# Patient Record
Sex: Female | Born: 1965 | Race: White | Hispanic: No | Marital: Married | Smoking: Never smoker
Health system: Southern US, Community
[De-identification: ages and names within clinical notes are randomized; demographics above are authoritative.]

## PROBLEM LIST (undated history)

## (undated) DIAGNOSIS — J45909 Unspecified asthma, uncomplicated: Secondary | ICD-10-CM

## (undated) DIAGNOSIS — Q348 Other specified congenital malformations of respiratory system: Secondary | ICD-10-CM

## (undated) DIAGNOSIS — E785 Hyperlipidemia, unspecified: Secondary | ICD-10-CM

## (undated) DIAGNOSIS — T7840XA Allergy, unspecified, initial encounter: Secondary | ICD-10-CM

## (undated) DIAGNOSIS — I10 Essential (primary) hypertension: Secondary | ICD-10-CM

## (undated) DIAGNOSIS — E119 Type 2 diabetes mellitus without complications: Secondary | ICD-10-CM

## (undated) HISTORY — DX: Essential (primary) hypertension: I10

## (undated) HISTORY — DX: Unspecified asthma, uncomplicated: J45.909

## (undated) HISTORY — PX: CHOLECYSTECTOMY: SHX55

## (undated) HISTORY — DX: Type 2 diabetes mellitus without complications: E11.9

## (undated) HISTORY — PX: APPENDECTOMY: SHX54

## (undated) HISTORY — DX: Allergy, unspecified, initial encounter: T78.40XA

## (undated) HISTORY — PX: DILATION AND CURETTAGE OF UTERUS: SHX78

## (undated) HISTORY — DX: Hyperlipidemia, unspecified: E78.5

## (undated) HISTORY — PX: ABLATION: SHX5711

## (undated) HISTORY — DX: Other specified congenital malformations of respiratory system: Q34.8

---

## 1999-10-17 ENCOUNTER — Other Ambulatory Visit: Admission: RE | Admit: 1999-10-17 | Discharge: 1999-10-17 | Payer: Self-pay | Admitting: Obstetrics & Gynecology

## 2000-07-11 ENCOUNTER — Encounter (INDEPENDENT_AMBULATORY_CARE_PROVIDER_SITE_OTHER): Payer: Self-pay | Admitting: Specialist

## 2000-07-11 ENCOUNTER — Ambulatory Visit (HOSPITAL_COMMUNITY): Admission: RE | Admit: 2000-07-11 | Discharge: 2000-07-11 | Payer: Self-pay | Admitting: Obstetrics & Gynecology

## 2000-12-23 ENCOUNTER — Other Ambulatory Visit: Admission: RE | Admit: 2000-12-23 | Discharge: 2000-12-23 | Payer: Self-pay | Admitting: Obstetrics & Gynecology

## 2001-03-17 ENCOUNTER — Ambulatory Visit (HOSPITAL_COMMUNITY): Admission: RE | Admit: 2001-03-17 | Discharge: 2001-03-17 | Payer: Self-pay | Admitting: Family Medicine

## 2001-08-17 ENCOUNTER — Ambulatory Visit (HOSPITAL_COMMUNITY): Admission: RE | Admit: 2001-08-17 | Discharge: 2001-08-17 | Payer: Self-pay | Admitting: Family Medicine

## 2001-08-17 ENCOUNTER — Encounter: Payer: Self-pay | Admitting: Family Medicine

## 2001-08-19 ENCOUNTER — Encounter: Payer: Self-pay | Admitting: Family Medicine

## 2001-08-19 ENCOUNTER — Ambulatory Visit (HOSPITAL_COMMUNITY): Admission: RE | Admit: 2001-08-19 | Discharge: 2001-08-19 | Payer: Self-pay | Admitting: Family Medicine

## 2001-10-05 ENCOUNTER — Encounter: Payer: Self-pay | Admitting: General Surgery

## 2001-10-08 ENCOUNTER — Observation Stay (HOSPITAL_COMMUNITY): Admission: RE | Admit: 2001-10-08 | Discharge: 2001-10-09 | Payer: Self-pay | Admitting: General Surgery

## 2001-10-08 ENCOUNTER — Encounter (INDEPENDENT_AMBULATORY_CARE_PROVIDER_SITE_OTHER): Payer: Self-pay | Admitting: Specialist

## 2002-01-17 ENCOUNTER — Other Ambulatory Visit: Admission: RE | Admit: 2002-01-17 | Discharge: 2002-01-17 | Payer: Self-pay | Admitting: Obstetrics & Gynecology

## 2002-02-17 ENCOUNTER — Ambulatory Visit (HOSPITAL_COMMUNITY): Admission: RE | Admit: 2002-02-17 | Discharge: 2002-02-17 | Payer: Self-pay | Admitting: Internal Medicine

## 2002-02-22 ENCOUNTER — Encounter: Payer: Self-pay | Admitting: Internal Medicine

## 2002-02-22 ENCOUNTER — Ambulatory Visit (HOSPITAL_COMMUNITY): Admission: RE | Admit: 2002-02-22 | Discharge: 2002-02-22 | Payer: Self-pay | Admitting: Internal Medicine

## 2003-02-06 ENCOUNTER — Other Ambulatory Visit: Admission: RE | Admit: 2003-02-06 | Discharge: 2003-02-06 | Payer: Self-pay | Admitting: Obstetrics & Gynecology

## 2003-05-18 ENCOUNTER — Ambulatory Visit (HOSPITAL_COMMUNITY): Admission: RE | Admit: 2003-05-18 | Discharge: 2003-05-18 | Payer: Self-pay | Admitting: Family Medicine

## 2003-05-18 ENCOUNTER — Encounter: Payer: Self-pay | Admitting: Family Medicine

## 2004-03-06 ENCOUNTER — Other Ambulatory Visit: Admission: RE | Admit: 2004-03-06 | Discharge: 2004-03-06 | Payer: Self-pay | Admitting: Obstetrics & Gynecology

## 2004-08-02 ENCOUNTER — Ambulatory Visit (HOSPITAL_COMMUNITY): Admission: RE | Admit: 2004-08-02 | Discharge: 2004-08-02 | Payer: Self-pay | Admitting: Family Medicine

## 2005-05-06 ENCOUNTER — Other Ambulatory Visit: Admission: RE | Admit: 2005-05-06 | Discharge: 2005-05-06 | Payer: Self-pay | Admitting: Obstetrics & Gynecology

## 2009-08-09 ENCOUNTER — Ambulatory Visit (HOSPITAL_COMMUNITY): Admission: RE | Admit: 2009-08-09 | Discharge: 2009-08-09 | Payer: Self-pay | Admitting: Family Medicine

## 2009-08-15 ENCOUNTER — Ambulatory Visit: Payer: Self-pay | Admitting: Urgent Care

## 2009-08-15 DIAGNOSIS — E119 Type 2 diabetes mellitus without complications: Secondary | ICD-10-CM

## 2009-08-15 DIAGNOSIS — R198 Other specified symptoms and signs involving the digestive system and abdomen: Secondary | ICD-10-CM | POA: Insufficient documentation

## 2009-08-15 DIAGNOSIS — R1032 Left lower quadrant pain: Secondary | ICD-10-CM | POA: Insufficient documentation

## 2009-08-15 DIAGNOSIS — R109 Unspecified abdominal pain: Secondary | ICD-10-CM | POA: Insufficient documentation

## 2009-08-15 DIAGNOSIS — J45909 Unspecified asthma, uncomplicated: Secondary | ICD-10-CM | POA: Insufficient documentation

## 2009-09-04 ENCOUNTER — Encounter: Payer: Self-pay | Admitting: Gastroenterology

## 2009-09-10 LAB — CONVERTED CEMR LAB
Bacteria, UA: NONE SEEN
Basophils Absolute: 0.1 10*3/uL (ref 0.0–0.1)
Bilirubin Urine: NEGATIVE
Crystals: NONE SEEN
Eosinophils Relative: 2 % (ref 0–5)
Lymphocytes Relative: 34 % (ref 12–46)
Lymphs Abs: 2.5 10*3/uL (ref 0.7–4.0)
Neutro Abs: 3.7 10*3/uL (ref 1.7–7.7)
Neutrophils Relative %: 51 % (ref 43–77)
Nitrite: NEGATIVE
Platelets: 352 10*3/uL (ref 150–400)
Protein, ur: NEGATIVE mg/dL
RBC / HPF: NONE SEEN (ref <3)
RDW: 13 % (ref 11.5–15.5)
Specific Gravity, Urine: 1.005 (ref 1.005–1.030)
Urobilinogen, UA: 0.2 (ref 0.0–1.0)
WBC: 7.1 10*3/uL (ref 4.0–10.5)

## 2009-10-31 ENCOUNTER — Encounter: Payer: Self-pay | Admitting: Internal Medicine

## 2009-11-08 LAB — HM COLONOSCOPY

## 2009-11-27 ENCOUNTER — Ambulatory Visit (HOSPITAL_BASED_OUTPATIENT_CLINIC_OR_DEPARTMENT_OTHER): Admission: RE | Admit: 2009-11-27 | Discharge: 2009-11-27 | Payer: Self-pay | Admitting: Urology

## 2010-10-31 NOTE — Letter (Signed)
Summary: RELEASE OF RECORDS TO PT  RELEASE OF RECORDS TO PT   Imported By: Diana Eves 10/31/2009 13:21:50  _____________________________________________________________________  External Attachment:    Type:   Image     Comment:   External Document

## 2010-12-13 IMAGING — CT CT PELVIS W/ CM
2 of 5 series · 16 of 46 positions shown, 18 images · IV contrast (Omnipaque 300)
Comparison: 02/22/2002

CT ABDOMEN

CLINICAL DATA: Left lower quadrant pain, past history of diabetes,
hypertension, appendectomy, cholecystectomy

CT ABDOMEN AND PELVIS WITH CONTRAST
TECHNIQUE: Multidetector CT imaging of the abdomen and pelvis was
performed using the standard protocol following bolus
administration of intravenous contrast. Breast shield utilized.
Sagittal and coronal MPR images reconstructed from axial data set.
Contrast: Dilute oral contrast and 100 ml Kmnipaque-JCC IV

[Series 2: abd_pel_with 5.0 b40f · axial · 0.65mm/px · z∈[-447,-37]mm · 13 of 94 slices shown, 15 images]
[im 6/94  soft-tissue]
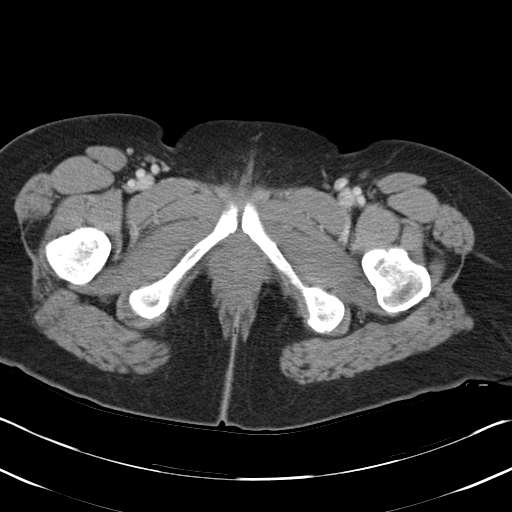
[im 6/94  bone]
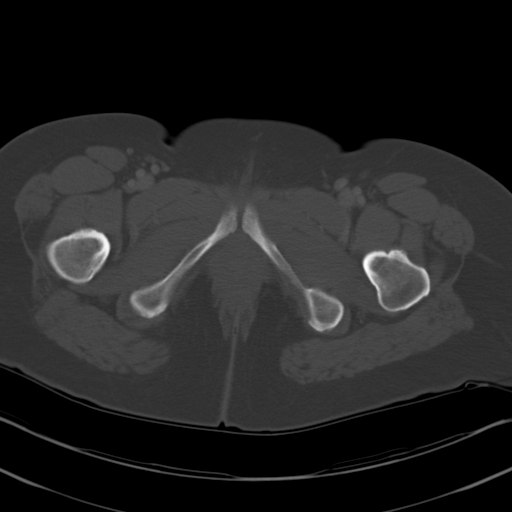
[im 11/94  soft-tissue]
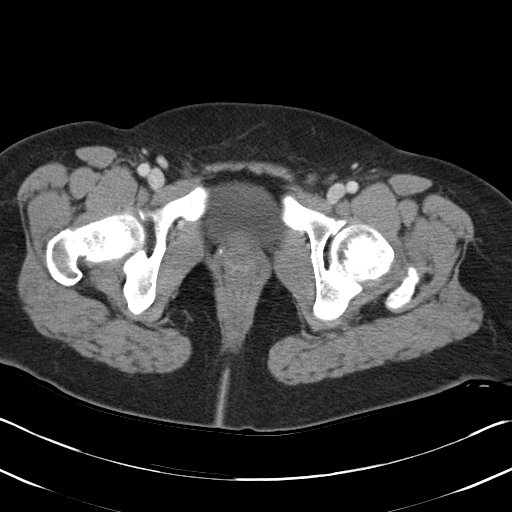
[im 22/94  soft-tissue]
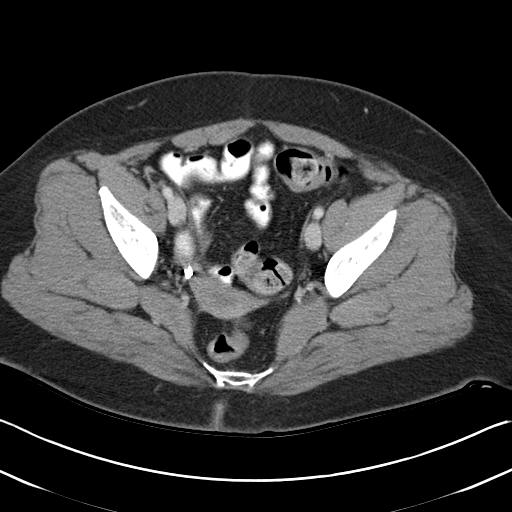
[im 28/94  soft-tissue]
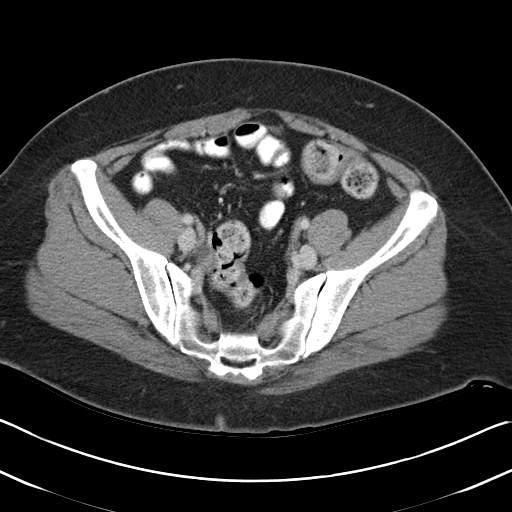
[im 33/94  soft-tissue]
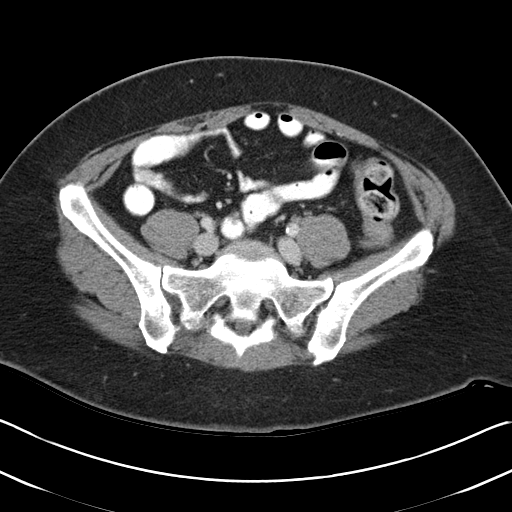
[im 39/94  soft-tissue]
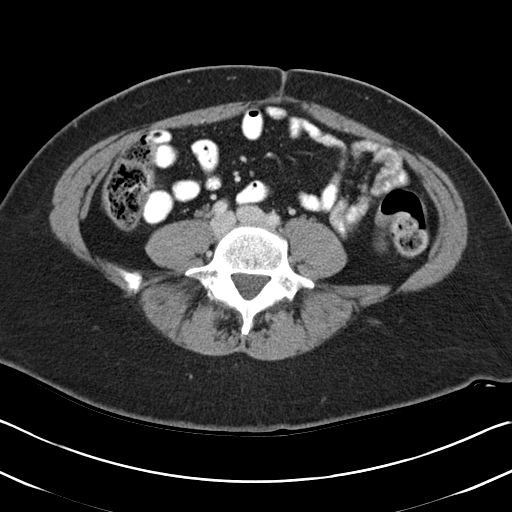
[im 50/94  soft-tissue]
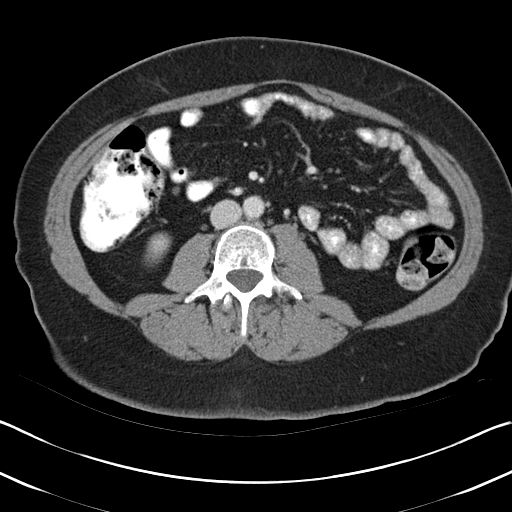
[im 55/94  soft-tissue]
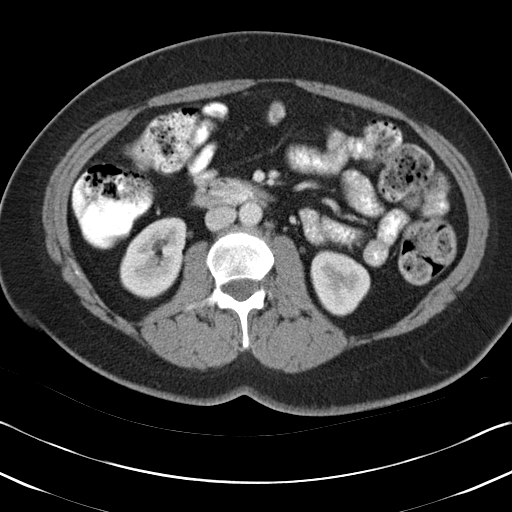
[im 61/94  soft-tissue]
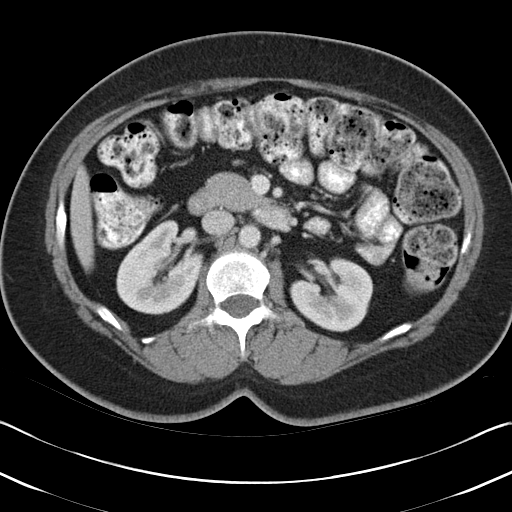
[im 61/94  bone]
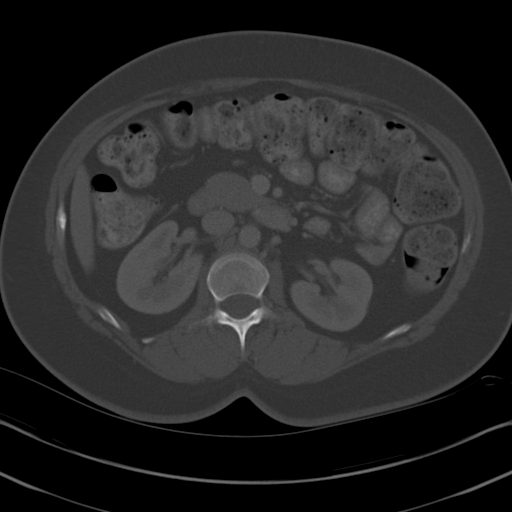
[im 66/94  soft-tissue]
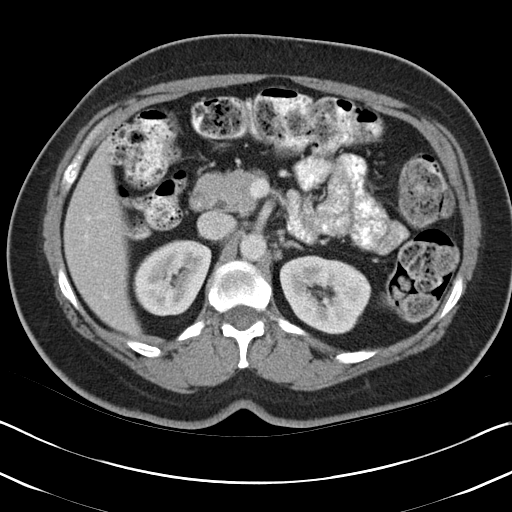
[im 72/94  soft-tissue]
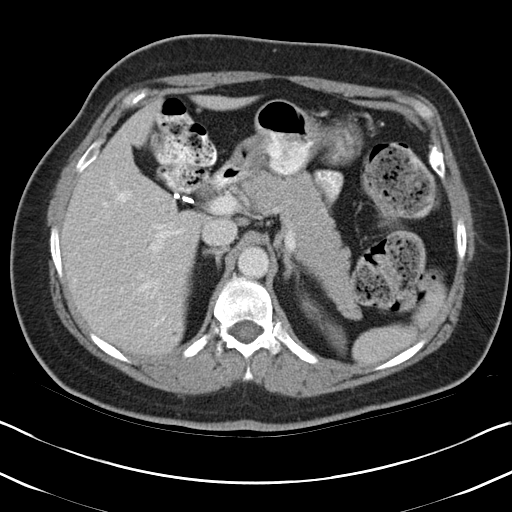
[im 83/94  soft-tissue]
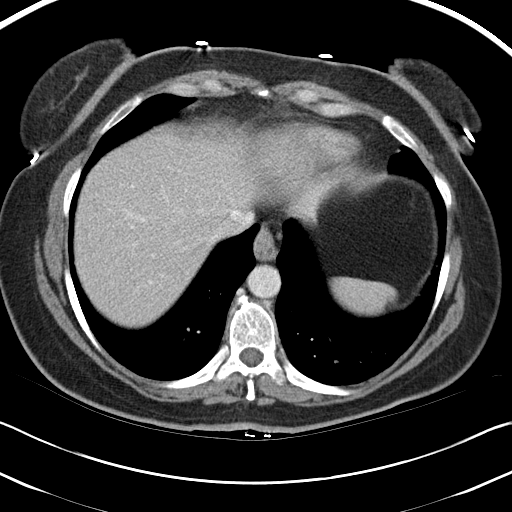
[im 88/94  soft-tissue]
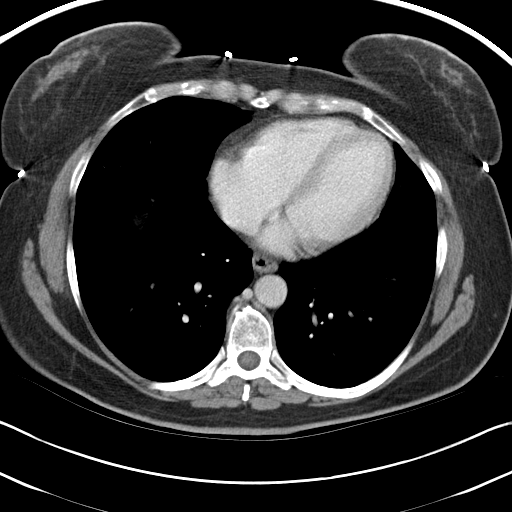

[Series 4: mpr cor post contrast (id) · coronal · 0.68mm/px · 3 of 73 slices shown]
[im 25/73  soft-tissue]
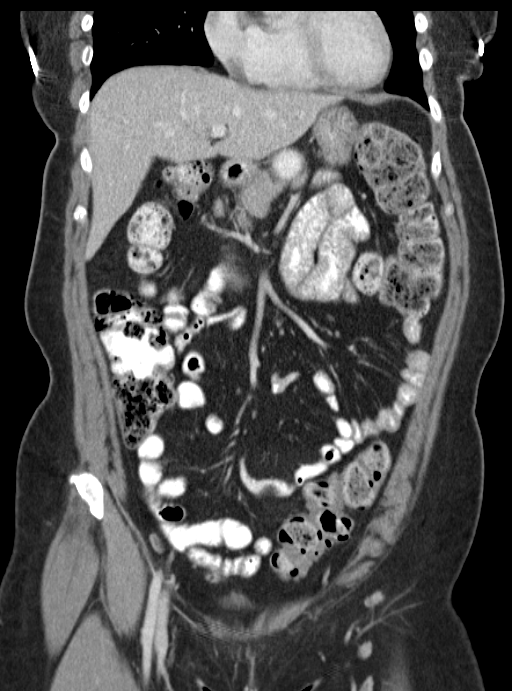
[im 33/73  soft-tissue]
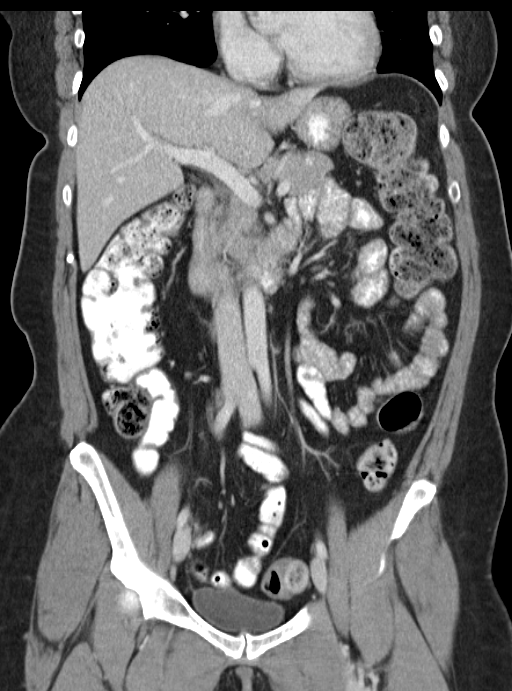
[im 41/73  soft-tissue]
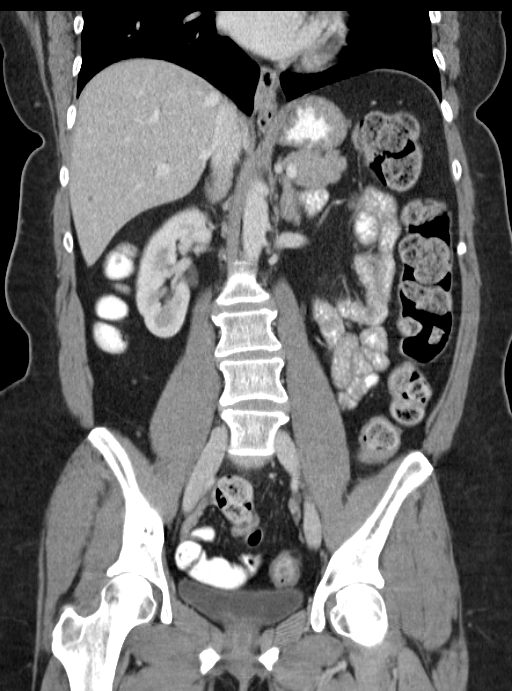

[16 of 46 positions shown; findings below may reference images not displayed]

FINDINGS: Lung bases clear.
Questionable 5 mm diameter low attenuation focus in right lobe
liver, image 26, not seen on prior exam.
Additional tiny less than 5 mm diameter low attenuation focus
medial aspect posterior right lobe liver image 23.
2 mm diameter nonobstructing calculus lower pole left kidney image
39.
Remainder of liver, spleen, pancreas, kidneys, and adrenal glands
unremarkable.
Tiny umbilical hernia containing fat.
No upper abdominal mass, adenopathy, free fluid or inflammatory
process.
Stomach and upper abdominal bowel loops unremarkable.
IMPRESSION: No acute upper abdominal process.
Tiny nonobstructing left renal calculus.
Tiny nonspecific low attenuation foci in liver, too small to
characterize.

CT PELVIS
FINDINGS: Unremarkable appearance of bladder, uterus, adnexae and distal
ureters.
Large and small bowel loops in pelvis normal.
No definite pelvic mass, adenopathy, free fluid or inflammatory
process.
Distal colon is incompletely distended, unable to adequately assess
wall thickness.
Question prior tubal ligation bilaterally.
No hernia or focal bony abnormality.
IMPRESSION: No acute intrapelvic abnormalities.

## 2010-12-20 LAB — GLUCOSE, CAPILLARY: Glucose-Capillary: 100 mg/dL — ABNORMAL HIGH (ref 70–99)

## 2010-12-20 LAB — POCT I-STAT, CHEM 8
BUN: 24 mg/dL — ABNORMAL HIGH (ref 6–23)
Chloride: 103 mEq/L (ref 96–112)
Glucose, Bld: 98 mg/dL (ref 70–99)
HCT: 46 % (ref 36.0–46.0)
Potassium: 5.4 mEq/L — ABNORMAL HIGH (ref 3.5–5.1)

## 2011-02-14 NOTE — Op Note (Signed)
[facility]  Patient:    Courtney Hunter, Courtney Hunter Visit Number: [id number] MRN: [id number]          Service Type: SUR Location: [id number] Attending Physician:  Delsa Bern Dictated by:   Lorne Skeens. Hoxworth, M.D. Proc. Date: 10/08/01 Admit Date:  10/08/2001 Discharge Date: 10/09/2001                             Operative Report  PREOPERATIVE DIAGNOSIS:  Biliary dyskinesia/chronic acalculous cholecystitis.  POSTOPERATIVE DIAGNOSIS:  Biliary dyskinesia/chronic acalculous cholecystitis.  SURGICAL PROCEDURE:  Laparoscopic cholecystectomy.  SURGEON:  Lorne Skeens. Hoxworth, M.D.  ASSISTANT:  Catalina Lunger, M.D.  ANESTHESIA:  General.  BRIEF HISTORY:  Ms. Walts is a 45 year old female, with recurrent episodes of right upper quadrant abdominal pain and nausea postprandially.  She has had a work-up of a negative abdominal ultrasound, but a HIDA scan showing reduced ejection fraction of 27%.  Her symptoms are persistent and classic for biliary disease, and we have elected to proceed with laparoscopic cholecystectomy in an attempt to relieve her symptoms.  The nature of the procedure, its indications, risks of bleeding, infection, bile leak, bile duct injury, and possible failure to relieve her symptoms were discussed and understood preoperatively.  She is now brought to the operating room for this procedure.  DESCRIPTION OF OPERATION:  The patient brought to the operating room and placed in the supine position on the operating table, and general endotracheal anesthesia was induced.  Preoperative antibiotics were given.  The abdomen was sterilely prepped and draped.  Local anesthesia was used to infiltrate the trocar sites prior to incision.  A 1 cm incision was made at the umbilicus and dissection carried down the midline fascia which was sharply incised for 1 cm. The peritoneum was entered under direct vision.  Through a mattress suture  of 0 Vicryl, the Hasson trocar was placed and pneumoperitoneum established. Under direct vision, a 10 mm trocar was placed in the subxiphoid area and two 5 mm trocars on the right subcostal margin.  The gallbladder was visualized and was not grossly inflamed.  The liver appeared normal.  The fundus was grasped and elevated up over the liver, and the infundibulum retracted inferolaterally.  Fibrofatty tissue was stripped off the neck of the gallbladder toward the porta hepatis.  The peritoneum and either side of the infundibulum was incised and the distal gallbladder and Calots triangle thoroughly dissected.  The cystic duct gallbladder junction was identified and the cystic duct dissected 360 degrees.  When the anatomy was cleared, the cystic duct was clipped at the junction with the gallbladder.  Operative cholangiogram was then attempted through the cystic duct, but the catheter wound not pass due to a valve or some other obstruction in the duct, and this was abandoned, and the cystic was doubly clipped proximally and divided.  The cystic artery clearly seen coursing up the gallbladder wall was doubly clipped proximally, clipped distally, and divided.  The gallbladder was then dissected free from its bed using hook cautery and removed through the umbilicus. complete hemostasis was assured in the operative site and the right upper quadrant thoroughly irrigated and suctioned until clear.  Trocars were removed under direct vision.  All CO2 evacuated from the peritoneal cavity.  The mattress suture was secured at the umbilicus.  Skin incisions were closed with interrupted subcuticular 4-0 Monocryl and Steri-Strips.  Sponge, needle, and instrument counts were correct.  Dry sterile dressings were applied.  The patient was taken to the recovery room in good condition. Dictated by:   Lorne Skeens. Hoxworth, M.D. Attending Physician:  Delsa Bern DD:  10/08/01 TD:  10/09/01 Job:  [id number]

## 2011-02-14 NOTE — Op Note (Signed)
[facility]  Patient:    Courtney Hunter, Courtney Hunter Visit Number: [id number] MRN: [id number]          Service Type: OUT Location: RAD Attending Physician:  Jonathon Bellows Dictated by:   Roetta Sessions, M.D. Proc. Date: 02/17/02 Admit Date:  02/22/2002   CC:         Lilyan Punt, M.D.   Operative Report  PROCEDURE:  Diagnostic esophagogastroduodenoscopy.  INDICATIONS FOR PROCEDURE:  The patient is a 45 year old lady with epigastric pain of five months duration. She underwent a laparoscopic cholecystectomy by Dr. Johna Sheriff in January of this year for biliary dyskinesia (gallbladder ______ of only 27% with reduction of the symptoms). Four days following cholecystectomy, she developed intermittent jabbing, boring epigastric pain which tends to occur at any time, sometimes is awakened from a sound sleep in the early morning hours. She has significantly improved with Prilosec and more recently a course of Nexium 40 mg orally daily but her symptoms have not received resolved. Interestingly, she tells me that her symptoms are improved with eating a meal. She says the right upper quadrant for which she underwent cholecystectomy have resolved. She occasionally has typical reflux symptoms but differentiates those from her current symptoms. She has not had any melena or rectal bleeding, she moves her bowels daily.  No early satiety, no nausea or vomiting. She does not use nonsteroidals and does not consume alcohol. EGD is now being done to further evaluate her symptoms. The approach has been discussed with the patient at length. The potential risks, benefits, and alternatives have been reviewed, questions answered. Please see my handwritten H&P for more information.  MONITORING:  O2 saturations, blood pressure, pulse and respirations monitored throughout the entire procedure.  CONSCIOUS SEDATION:  Versed 4 mg IV, Demerol 100 mg IV in divided doses.  INSTRUMENT:   Olympus video chip gastroscope.  FINDINGS:  Examination of the tubular esophagus revealed no mucosal abnormalities. There was an accentuated endulating Z line; however, the mucosa otherwise appeared normal. The LES was slight patulous. The EG junction was easily traversed.  STOMACH:  The gastric cavity was empty and insufflated well with air. A thorough examination of the gastric mucosa including a retroflexed view of the proximal stomach and esophagogastric junction demonstrated only a small hiatal hernia. The pylorus was patent and easily traversed.  DUODENUM:  The bulb and second portion appeared normal.  THERAPEUTIC/DIAGNOSTIC MANEUVERS PERFORMED:  None.  The patient tolerated the procedure very well and was reacted in endoscopy.  IMPRESSION:  Normal esophagus with slightly patulous lower esophageal sphincter. Small hiatal hernia and  remainder of stomach and duodenum through the second portion appeared normal.  No endoscopic explanation for the patients symptoms. She has responded to acid suppression therapy but her symptoms have not resolved. It is interesting that these new symptoms occurred four days after cholecystectomy. One wonders if she did not have a self limiting bile leak to account for some of her more prominent symptoms early on and she has jut improved spontaneously over time and the improvement with Nexium is coincidental. Alternatively, biliary dyskinesia needs to be kept in the differential. Clinically her symptoms sound somewhat like peptic disease. She may have had an ulcer which is now healed.  RECOMMENDATIONS:  1. Continue Nexium 40 mg orally daily.  2. Begin Levbid one tablet orally b.i.d.  3. CBC, amylase, LFTs, and metabolic 7 today and will go ahead and do an     abdominal CT scan to further evaluate her symptoms.  4. Further recommendations to follow.  I would like to thank Dr. Lilyan Punt for asking me to see this nice lady today. Dictated by:    Roetta Sessions, M.D. Attending Physician:  Jonathon Bellows DD:  02/17/02 TD:  02/21/02 Job: [phone] [id number] [id number]

## 2011-02-14 NOTE — Op Note (Signed)
[facility]  Patient:    Courtney Hunter, Courtney Hunter                     MRN: [id number] Proc. Date: 07/11/00 Adm. Date:  [id number] Attending:  Minette Headland                           Operative Report  PREOPERATIVE DIAGNOSES:  Dysfunctional uterine bleeding, abnormal endometrial cavity with marked thickening and tissue within the uterus measuring approximately 19 mm.  POSTOPERATIVE DIAGNOSES:  Dysfunctional uterine bleeding, abnormal endometrial cavity with marked thickening and tissue within the uterus measuring approximately 19 mucous membranes with filmy adhesions and marked irregularity of uterine cavity.  OPERATIVE PROCEDURE:  Hysteroscopy, dilatation and curettage.  SURGEON:  Freddy Finner, M.D.  ANESTHESIA:  Managed intravenous sedation and paracervical block.  HISTORY OF PRESENT ILLNESS:  The patient is a 45 year old white married female gravida 2, para 1 who has had bleeding consistently since September 3. An attempt has been made to manage her bleeding with ______ oral contraceptives t.i.d. which did slow the bleeding to some degree but then it immediately picked up again. She has had episodes of very heavy bleeding and passage of clots and has flooding accidents at work. On pelvic ultrasound in the office the day prior to surgery, she had a small simple cyst of the left ovary and marked endometrial thickening to 18.9 mm. She was admitted today for D&C.  DESCRIPTION OF PROCEDURE:  She was admitted on the morning of surgery and brought to the operating room. There placed on adequate intravenous sedation. Placed in the dorsolithotomy position using the [employer]. Betadine prep was carried out in the usual fashion. Bivalve speculum was introduced after sterile drapes were applied. Cervix was visualized and a paracervical block was placed using a total of 10 cc of 1% Xylocaine. Injections were made at 4 and 8 oclock in the vaginal  fornices. Cervix was then grasped with a single-tooth tenaculum, progressively dilated to 25 with Hanks dilators. The ACMI 12.5 hysteroscope was introduced using 3% sorbitol as a distending medium and examination of the endometrial cavity was carried out with findings as noted above. ______ curettage was then accomplished with a Heaney curette and the cavity was explored with Randall stone forceps. A moderate amount of tissue was obtained. Reinspection revealed a more normal-appearing cavity. Procedure was then terminated. All instruments were removed. The sorbitol deficit was 50 cc. The patient was taken to recovery in good condition. Will be discharged in the immediate postop period for follow-up in the office in approximately 2 weeks. She is to take ibuprofen or Tylenol as needed for postoperative pain or Darvocet for more severe pain. DD:  07/11/00 TD:  07/11/00 Job: [id number]

## 2011-10-20 ENCOUNTER — Ambulatory Visit (HOSPITAL_COMMUNITY)
Admission: RE | Admit: 2011-10-20 | Discharge: 2011-10-20 | Disposition: A | Discharge status: Home / Self Care | Payer: 59 | Source: Ambulatory Visit | Attending: Family Medicine | Admitting: Family Medicine

## 2011-10-20 ENCOUNTER — Other Ambulatory Visit: Payer: Self-pay | Admitting: Family Medicine

## 2011-10-20 DIAGNOSIS — M25561 Pain in right knee: Secondary | ICD-10-CM

## 2011-10-20 DIAGNOSIS — M25569 Pain in unspecified knee: Secondary | ICD-10-CM | POA: Insufficient documentation

## 2011-11-14 ENCOUNTER — Other Ambulatory Visit (HOSPITAL_COMMUNITY): Payer: Self-pay | Admitting: Orthopedic Surgery

## 2011-11-14 DIAGNOSIS — M25561 Pain in right knee: Secondary | ICD-10-CM

## 2011-11-18 ENCOUNTER — Ambulatory Visit (HOSPITAL_COMMUNITY): Payer: 59

## 2011-12-02 ENCOUNTER — Ambulatory Visit (HOSPITAL_COMMUNITY)
Admission: RE | Admit: 2011-12-02 | Discharge: 2011-12-02 | Disposition: A | Discharge status: Home / Self Care | Payer: 59 | Source: Ambulatory Visit | Attending: Orthopedic Surgery | Admitting: Orthopedic Surgery

## 2011-12-02 DIAGNOSIS — M23329 Other meniscus derangements, posterior horn of medial meniscus, unspecified knee: Secondary | ICD-10-CM | POA: Insufficient documentation

## 2011-12-02 DIAGNOSIS — IMO0002 Reserved for concepts with insufficient information to code with codable children: Secondary | ICD-10-CM | POA: Insufficient documentation

## 2011-12-02 DIAGNOSIS — M1710 Unilateral primary osteoarthritis, unspecified knee: Secondary | ICD-10-CM | POA: Insufficient documentation

## 2011-12-02 DIAGNOSIS — M25569 Pain in unspecified knee: Secondary | ICD-10-CM | POA: Insufficient documentation

## 2011-12-02 DIAGNOSIS — M25561 Pain in right knee: Secondary | ICD-10-CM

## 2012-01-13 ENCOUNTER — Ambulatory Visit
Admission: RE | Admit: 2012-01-13 | Discharge: 2012-01-13 | Disposition: A | Discharge status: Home / Self Care | Payer: 59 | Source: Ambulatory Visit | Attending: Orthopedic Surgery | Admitting: Orthopedic Surgery

## 2012-01-13 ENCOUNTER — Other Ambulatory Visit: Payer: Self-pay | Admitting: Orthopedic Surgery

## 2012-01-13 DIAGNOSIS — M79669 Pain in unspecified lower leg: Secondary | ICD-10-CM

## 2013-01-06 ENCOUNTER — Telehealth: Payer: Self-pay | Admitting: Family Medicine

## 2013-01-06 DIAGNOSIS — E119 Type 2 diabetes mellitus without complications: Secondary | ICD-10-CM

## 2013-01-06 DIAGNOSIS — Z79899 Other long term (current) drug therapy: Secondary | ICD-10-CM

## 2013-01-06 DIAGNOSIS — E782 Mixed hyperlipidemia: Secondary | ICD-10-CM

## 2013-01-06 NOTE — Telephone Encounter (Signed)
Please do blood work papers for visit on 4/25

## 2013-01-07 NOTE — Telephone Encounter (Signed)
Blood work papers printed and left up front for patient. Patient notified. 

## 2013-01-07 NOTE — Telephone Encounter (Signed)
Metabolic 7, lipid profile, liver profile, hemoglobin A1c, urine micro-protein

## 2013-01-14 ENCOUNTER — Encounter: Payer: Self-pay | Admitting: *Deleted

## 2013-01-21 ENCOUNTER — Ambulatory Visit: Payer: Self-pay | Admitting: Family Medicine

## 2013-01-25 ENCOUNTER — Ambulatory Visit: Payer: Self-pay | Admitting: Family Medicine

## 2013-01-25 ENCOUNTER — Other Ambulatory Visit: Payer: Self-pay | Admitting: Family Medicine

## 2013-01-27 ENCOUNTER — Other Ambulatory Visit: Payer: Self-pay | Admitting: Family Medicine

## 2013-01-28 LAB — LIPID PANEL
HDL: 44 mg/dL (ref >39)
Total CHOL/HDL Ratio: 4 Ratio
Triglycerides: 201 mg/dL — ABNORMAL HIGH (ref <150)

## 2013-01-28 LAB — BASIC METABOLIC PANEL
Calcium: 9.9 mg/dL (ref 8.4–10.5)
Creat: 0.99 mg/dL (ref 0.50–1.10)
Sodium: 139 mEq/L (ref 135–145)

## 2013-01-28 LAB — HEPATIC FUNCTION PANEL
ALT: 22 U/L (ref 0–35)
AST: 18 U/L (ref 0–37)
Albumin: 4.1 g/dL (ref 3.5–5.2)
Alkaline Phosphatase: 57 U/L (ref 39–117)
Total Protein: 6.7 g/dL (ref 6.0–8.3)

## 2013-01-28 LAB — HEMOGLOBIN A1C
Hgb A1c MFr Bld: 5.5 % (ref <5.7)
Mean Plasma Glucose: 111 mg/dL (ref <117)

## 2013-02-01 ENCOUNTER — Encounter: Payer: Self-pay | Admitting: Family Medicine

## 2013-02-01 ENCOUNTER — Ambulatory Visit (INDEPENDENT_AMBULATORY_CARE_PROVIDER_SITE_OTHER): Payer: 59 | Admitting: Family Medicine

## 2013-02-01 VITALS — BP 130/78 | HR 80 | Wt 190.6 lb

## 2013-02-01 DIAGNOSIS — K219 Gastro-esophageal reflux disease without esophagitis: Secondary | ICD-10-CM | POA: Insufficient documentation

## 2013-02-01 DIAGNOSIS — E119 Type 2 diabetes mellitus without complications: Secondary | ICD-10-CM

## 2013-02-01 DIAGNOSIS — J309 Allergic rhinitis, unspecified: Secondary | ICD-10-CM

## 2013-02-01 DIAGNOSIS — E785 Hyperlipidemia, unspecified: Secondary | ICD-10-CM | POA: Insufficient documentation

## 2013-02-01 DIAGNOSIS — I10 Essential (primary) hypertension: Secondary | ICD-10-CM | POA: Insufficient documentation

## 2013-02-01 DIAGNOSIS — G4700 Insomnia, unspecified: Secondary | ICD-10-CM

## 2013-02-01 MED ORDER — ALPRAZOLAM 1 MG PO TABS: 1.0000 mg | ORAL_TABLET | Freq: Every evening | ORAL | Status: DC | PRN

## 2013-02-01 NOTE — Progress Notes (Signed)
  Subjective:    Patient ID: Courtney Hunter, female    DOB: 29-Feb-1966, 47 y.o.   MRN: [id number]  HPI Weight watchers-patient is trying to lose weight she is joining [employer]. They're trying to have her journal her diet. Also trying to keep her more physically active she feels that this will help her bring her weight down and help her other health issues. Prediabetes-she is trying to watch his starches in her diet she is taking metformin once a day she denies blurred vision excessive thirst or urination denies numbness or tingling in the feet. hyperetension-she is state that she is doing a good job of taking her medications and she is trying to lose weight as indicated above she denies headaches chest pressure or shortness of breath Hyperlipidemia-she is trying to limit fried foods she is taking her medications she recently had lab work completed. Allergic rhinitis-she does relate some mild allergy issues congestion sneezing postnasal drip she is using her spray and medication it helped somewhat Reflux-she states that she does not take her PPI she does have significant heartburn and discomfort so therefore she wants to continue taking her medicine. Foot pain-she relates that she had right heel pain after doing 3-4 miles or walking a few weeks ago for the first time in a while. Denies any other injury. Has tried some medication for without much success. It sounds like she may have plantar faciitis  Past medical history, allergic rhinitis, hypertension, asthma, hyperlipidemia, prediabetes. Family history noncontributory Does not smoke    Review of Systems See above.    Objective:   Physical Examno acute distress. Eardrums normal throat is normal neck no masses lungs are clear heart is regular pulses normal abdomen soft obese extremities no edema tenderness in the right heel. Blood pressure was rechecked. Normal.       Assessment & Plan:  #1 weight loss-I. Do recommend the patient  try to lose 20-30 pounds. Weight Watchers is a good idea for her. Reflux-continue omeprazole daily keeps it under good control Hyperlipidemia she is at her goal she is to continue her medication and a low-fat diet Prediabetes under good control continue metformin Hypertension good control continue current medication Allergic rhinitis-continue medication and Flonase. Should gradually improve as pollen counts go down. I recommend for the patient followup again in 6 months time Plantar fasciitis showed her some exercises she can do anti-inflammatories when necessary followup with podiatry if ongoing trouble may need to do an injection. Insomnia and Xanax at nighttime when necessary.

## 2013-02-22 ENCOUNTER — Other Ambulatory Visit: Payer: Self-pay | Admitting: Family Medicine

## 2013-03-14 ENCOUNTER — Other Ambulatory Visit: Payer: Self-pay | Admitting: Family Medicine

## 2013-03-22 ENCOUNTER — Other Ambulatory Visit: Payer: Self-pay | Admitting: Family Medicine

## 2013-04-06 ENCOUNTER — Other Ambulatory Visit: Payer: Self-pay | Admitting: Family Medicine

## 2013-04-27 ENCOUNTER — Other Ambulatory Visit: Payer: Self-pay | Admitting: *Deleted

## 2013-04-27 MED ORDER — PRAVASTATIN SODIUM 40 MG PO TABS: 40.0000 mg | ORAL_TABLET | Freq: Every day | ORAL | Status: DC

## 2013-04-29 ENCOUNTER — Other Ambulatory Visit: Payer: Self-pay | Admitting: Family Medicine

## 2013-05-20 ENCOUNTER — Other Ambulatory Visit: Payer: Self-pay | Admitting: Family Medicine

## 2013-06-13 ENCOUNTER — Other Ambulatory Visit: Payer: Self-pay | Admitting: Family Medicine

## 2013-06-16 ENCOUNTER — Other Ambulatory Visit: Payer: Self-pay | Admitting: Family Medicine

## 2013-06-30 ENCOUNTER — Other Ambulatory Visit: Payer: Self-pay | Admitting: *Deleted

## 2013-06-30 MED ORDER — CITALOPRAM HYDROBROMIDE 20 MG PO TABS: 20.0000 mg | ORAL_TABLET | Freq: Every day | ORAL | Status: DC

## 2013-06-30 MED ORDER — HYDROCHLOROTHIAZIDE 25 MG PO TABS: ORAL_TABLET | ORAL | Status: DC

## 2013-07-01 ENCOUNTER — Telehealth: Payer: Self-pay | Admitting: Family Medicine

## 2013-07-01 ENCOUNTER — Other Ambulatory Visit: Payer: Self-pay | Admitting: *Deleted

## 2013-07-01 MED ORDER — HYDROCHLOROTHIAZIDE 25 MG PO TABS: ORAL_TABLET | ORAL | Status: DC

## 2013-07-01 MED ORDER — CITALOPRAM HYDROBROMIDE 20 MG PO TABS: 20.0000 mg | ORAL_TABLET | Freq: Every day | ORAL | Status: DC

## 2013-07-01 NOTE — Telephone Encounter (Signed)
States she needs to have blood work completed for her 6 month follow up on 08/12/2013 @ 8:10am.  Please call patient to let her know when she can go to the lab.

## 2013-07-08 ENCOUNTER — Other Ambulatory Visit: Payer: Self-pay | Admitting: Nurse Practitioner

## 2013-07-08 DIAGNOSIS — E785 Hyperlipidemia, unspecified: Secondary | ICD-10-CM

## 2013-07-08 DIAGNOSIS — E119 Type 2 diabetes mellitus without complications: Secondary | ICD-10-CM

## 2013-07-08 DIAGNOSIS — Z79899 Other long term (current) drug therapy: Secondary | ICD-10-CM

## 2013-07-20 ENCOUNTER — Other Ambulatory Visit: Payer: Self-pay | Admitting: *Deleted

## 2013-07-20 MED ORDER — ALPRAZOLAM 1 MG PO TABS: 1.0000 mg | ORAL_TABLET | Freq: Every evening | ORAL | Status: DC | PRN

## 2013-07-25 ENCOUNTER — Other Ambulatory Visit: Payer: Self-pay | Admitting: *Deleted

## 2013-07-25 MED ORDER — ALPRAZOLAM 1 MG PO TABS: 1.0000 mg | ORAL_TABLET | Freq: Every evening | ORAL | Status: DC | PRN

## 2013-08-03 ENCOUNTER — Other Ambulatory Visit: Payer: Self-pay | Admitting: Family Medicine

## 2013-08-09 ENCOUNTER — Telehealth: Payer: Self-pay | Admitting: Family Medicine

## 2013-08-09 NOTE — Telephone Encounter (Signed)
Spoke with patient and explained to her she didn't need the BW papers and that she can just show up to get the BW done. Pt verbalized understanding and we will see her on the 20th.

## 2013-08-09 NOTE — Telephone Encounter (Signed)
Pt has appt on 11/20 please send BW orders and call pt when done

## 2013-08-09 NOTE — Telephone Encounter (Signed)
She should get those labs as ordered. If patient wants additional lab was then add. If she needs me papers been given to her please thank you

## 2013-08-09 NOTE — Telephone Encounter (Signed)
Courtney Hunter already ordered blood work in the system on 07/08/13. Should patient have this completed or do you want to order something else?

## 2013-08-12 ENCOUNTER — Ambulatory Visit: Payer: 59 | Admitting: Family Medicine

## 2013-08-15 LAB — HEPATIC FUNCTION PANEL
ALT: 25 U/L (ref 0–35)
Alkaline Phosphatase: 57 U/L (ref 39–117)
Bilirubin, Direct: 0.1 mg/dL (ref 0.0–0.3)
Total Protein: 6.8 g/dL (ref 6.0–8.3)

## 2013-08-15 LAB — LIPID PANEL
Cholesterol: 210 mg/dL — ABNORMAL HIGH (ref 0–200)
Triglycerides: 251 mg/dL — ABNORMAL HIGH (ref <150)
VLDL: 50 mg/dL — ABNORMAL HIGH (ref 0–40)

## 2013-08-15 LAB — HEMOGLOBIN A1C: Hgb A1c MFr Bld: 5.8 % — ABNORMAL HIGH (ref <5.7)

## 2013-08-17 ENCOUNTER — Other Ambulatory Visit: Payer: Self-pay | Admitting: Family Medicine

## 2013-08-18 ENCOUNTER — Encounter: Payer: Self-pay | Admitting: Family Medicine

## 2013-08-18 ENCOUNTER — Ambulatory Visit (INDEPENDENT_AMBULATORY_CARE_PROVIDER_SITE_OTHER): Payer: 59 | Admitting: Family Medicine

## 2013-08-18 VITALS — BP 128/88 | Ht 60.0 in | Wt 195.0 lb

## 2013-08-18 DIAGNOSIS — I10 Essential (primary) hypertension: Secondary | ICD-10-CM

## 2013-08-18 DIAGNOSIS — K219 Gastro-esophageal reflux disease without esophagitis: Secondary | ICD-10-CM

## 2013-08-18 DIAGNOSIS — E119 Type 2 diabetes mellitus without complications: Secondary | ICD-10-CM

## 2013-08-18 DIAGNOSIS — R7303 Prediabetes: Secondary | ICD-10-CM

## 2013-08-18 DIAGNOSIS — R7309 Other abnormal glucose: Secondary | ICD-10-CM

## 2013-08-18 DIAGNOSIS — Z23 Encounter for immunization: Secondary | ICD-10-CM

## 2013-08-18 MED ORDER — ALPRAZOLAM 1 MG PO TABS: 1.0000 mg | ORAL_TABLET | Freq: Every evening | ORAL | Status: DC | PRN

## 2013-08-18 NOTE — Progress Notes (Signed)
  Subjective:    Patient ID: Courtney Hunter, female    DOB: 01-Jun-1966, 47 y.o.   MRN: [id number]  Diabetes She presents for her follow-up diabetic visit. She has type 2 diabetes mellitus. Her disease course has been stable. Pertinent negatives for hypoglycemia include no confusion or dizziness. Pertinent negatives for diabetes include no blurred vision, no chest pain, no fatigue, no foot ulcerations and no weight loss. Pertinent negatives for hypoglycemia complications include no blackouts and no hospitalization. Symptoms are resolved. Pertinent negatives for diabetic complications include no autonomic neuropathy, impotence, nephropathy, peripheral neuropathy or PVD. Risk factors for coronary artery disease include diabetes mellitus, dyslipidemia, family history and obesity. Current diabetic treatment includes oral agent (monotherapy). She is compliant with treatment all of the time. Her weight is stable. She is following a diabetic diet. Meal planning includes avoidance of concentrated sweets and calorie counting. She participates in exercise intermittently (has heel spur). An ACE inhibitor/angiotensin II receptor blocker is being taken. She sees a podiatrist.Eye exam is not current (reminder to do it given).   Patient is here today for a 6 month check up. Patient has reflux under fairly good control has hypertension she takes medication watches her diet also has cholesterol issues watch his diet takes medication. Her A1C was 5.8. Blood sugars have been WNL.  Would like the Flu vaccine.   Review of Systems  Constitutional: Negative for weight loss, appetite change and fatigue.  HENT: Negative for rhinorrhea and sinus pressure.   Eyes: Negative for blurred vision.  Respiratory: Negative for shortness of breath and wheezing.   Cardiovascular: Negative for chest pain.  Gastrointestinal: Negative for abdominal pain.  Genitourinary: Negative for impotence and difficulty urinating.  Musculoskeletal:  Positive for arthralgias (knee pain /uses ice). Negative for back pain.  Neurological: Negative for dizziness.  Psychiatric/Behavioral: Negative for confusion.       Objective:   Physical Exam  Vitals reviewed. Constitutional: She is oriented to person, place, and time. She appears well-developed. No distress.  HENT:  Head: Normocephalic and atraumatic.  Neck: Normal range of motion. Neck supple.  Cardiovascular: Normal rate, regular rhythm and normal heart sounds.   No murmur heard. Pulmonary/Chest: Effort normal and breath sounds normal. No respiratory distress. She has no wheezes.  Abdominal: Soft.  Musculoskeletal: Normal range of motion. She exhibits no edema.  Neurological: She is alert and oriented to person, place, and time. No cranial nerve deficit.          Assessment & Plan:  #1 diabetes-actually prediabetes. Patient under good control watch diet stay physically active. If A1c goes up in the future may consider Victoza #2 blood pressure under good control, continue current medication #3 reflux under good control continue current medication #4 hyperlipidemia lab work slightly worse than what it was but still acceptable watch diet continue medication Followup 6 months 25 minutes spent with patient discussing all of these issues

## 2013-09-08 ENCOUNTER — Other Ambulatory Visit: Payer: Self-pay | Admitting: Family Medicine

## 2013-09-17 ENCOUNTER — Other Ambulatory Visit: Payer: Self-pay | Admitting: Family Medicine

## 2013-09-30 ENCOUNTER — Other Ambulatory Visit: Payer: Self-pay | Admitting: Family Medicine

## 2013-10-25 ENCOUNTER — Telehealth: Payer: Self-pay | Admitting: Nurse Practitioner

## 2013-10-25 NOTE — Telephone Encounter (Signed)
Patients husband was seen yesterday and wanted to know if we can just call something in For her coughing, low grade fever (around 100, 101). No body aches and has inhaler at home.  [facility]

## 2013-10-26 ENCOUNTER — Encounter: Payer: Self-pay | Admitting: Family Medicine

## 2013-10-26 ENCOUNTER — Ambulatory Visit (INDEPENDENT_AMBULATORY_CARE_PROVIDER_SITE_OTHER): Payer: 59 | Admitting: Family Medicine

## 2013-10-26 VITALS — BP 124/80 | Temp 100.0°F | Ht 60.0 in | Wt 191.0 lb

## 2013-10-26 DIAGNOSIS — J111 Influenza due to unidentified influenza virus with other respiratory manifestations: Secondary | ICD-10-CM

## 2013-10-26 DIAGNOSIS — J209 Acute bronchitis, unspecified: Secondary | ICD-10-CM

## 2013-10-26 DIAGNOSIS — J45909 Unspecified asthma, uncomplicated: Secondary | ICD-10-CM

## 2013-10-26 MED ORDER — CEFPROZIL 500 MG PO TABS: 500.0000 mg | ORAL_TABLET | Freq: Two times a day (BID) | ORAL | Status: DC

## 2013-10-26 MED ORDER — PREDNISONE 20 MG PO TABS: ORAL_TABLET | ORAL | Status: DC

## 2013-10-26 NOTE — Patient Instructions (Signed)

## 2013-10-26 NOTE — Telephone Encounter (Signed)
Having fever for 2 days, cough, chest congestion, wheezing. Pt advised we cannot call in antibiotics without being seen. Pt made appt for today.

## 2013-10-26 NOTE — Progress Notes (Signed)
   Subjective:    Patient ID: Courtney Hunter, female    DOB: January 20, 1966, 48 y.o.   MRN: [id number]  Cough This is a new problem. The current episode started in the past 7 days. Associated symptoms include a fever, headaches and wheezing. Associated symptoms comments: Back pain. Treatments tried: OTC cold meds. The treatment provided no relief.   Sun with some fatigue Monday coughed a lot, not feeling good Tuesday 101 felt fatigued, coughing, headache, Wed-  Some fvevr , cough, sweat, backaches, no N or V PMH benign  Review of Systems  Constitutional: Positive for fever.  Respiratory: Positive for cough and wheezing.   Neurological: Positive for headaches.       Objective:   Physical Exam  Nursing note and vitals reviewed. Constitutional: She appears well-developed.  HENT:  Head: Normocephalic.  Nose: Nose normal.  Mouth/Throat: Oropharynx is clear and moist. No oropharyngeal exudate.  Neck: Neck supple.  Cardiovascular: Normal rate and normal heart sounds.   No murmur heard. Pulmonary/Chest: Effort normal and breath sounds normal. She has no wheezes.  Lymphadenopathy:    She has no cervical adenopathy.  Skin: Skin is warm and dry.          Assessment & Plan:  Influenza-the patient was diagnosed with influenza. Patient/family educated about the flu and warning signs to watch for. If difficulty breathing, severe neck pain and stiffness, cyanosis, disorientation, or progressive worsening then immediately get rechecked at that ER. If progressive symptoms be certain to be rechecked. Supportive measures such as Tylenol/ibuprofen was discussed. No aspirin use in children. And influenza home care instruction sheet was given.  It is felt that this patient is dealing with a mild case of the flu may end up getting some secondary infections this was discussed with the patient. I believe she is starting to show some bronchitis symptoms as a result. Prescription for prednisone  Serevent as well as antibiotics.

## 2013-10-26 NOTE — Telephone Encounter (Signed)
NTC- review sx

## 2013-11-16 ENCOUNTER — Other Ambulatory Visit: Payer: Self-pay | Admitting: Family Medicine

## 2013-12-19 ENCOUNTER — Other Ambulatory Visit: Payer: Self-pay | Admitting: Family Medicine

## 2013-12-20 ENCOUNTER — Other Ambulatory Visit: Payer: Self-pay | Admitting: Family Medicine

## 2014-01-16 ENCOUNTER — Other Ambulatory Visit: Payer: Self-pay | Admitting: Family Medicine

## 2014-01-16 NOTE — Telephone Encounter (Signed)
Seen on 10/18/13 for sickness

## 2014-01-17 NOTE — Telephone Encounter (Signed)
Refill x2 will need followup office visit

## 2014-01-19 ENCOUNTER — Telehealth: Payer: Self-pay | Admitting: Family Medicine

## 2014-01-19 DIAGNOSIS — E119 Type 2 diabetes mellitus without complications: Secondary | ICD-10-CM

## 2014-01-19 DIAGNOSIS — R5381 Other malaise: Secondary | ICD-10-CM

## 2014-01-19 DIAGNOSIS — E785 Hyperlipidemia, unspecified: Secondary | ICD-10-CM

## 2014-01-19 DIAGNOSIS — Z79899 Other long term (current) drug therapy: Secondary | ICD-10-CM

## 2014-01-19 DIAGNOSIS — R5383 Other fatigue: Secondary | ICD-10-CM

## 2014-01-19 NOTE — Telephone Encounter (Signed)
Bloodwork ordered in the system. Patient was notified.  

## 2014-01-19 NOTE — Telephone Encounter (Signed)
TSH, lipid, met 7 , irine micro,A1C and f/u ov

## 2014-01-19 NOTE — Telephone Encounter (Signed)
Patient needs BW ordered, also would like to check her thyroid.

## 2014-01-19 NOTE — Telephone Encounter (Signed)
08/15/13 had A1C, lipid, liver, microalb urine. Pt requesting TSH

## 2014-01-26 ENCOUNTER — Other Ambulatory Visit: Payer: Self-pay | Admitting: Family Medicine

## 2014-01-27 LAB — BASIC METABOLIC PANEL
BUN: 15 mg/dL (ref 6–23)
CALCIUM: 9.4 mg/dL (ref 8.4–10.5)
CO2: 28 mEq/L (ref 19–32)
Chloride: 101 mEq/L (ref 96–112)
Creat: 0.89 mg/dL (ref 0.50–1.10)
Glucose, Bld: 111 mg/dL — ABNORMAL HIGH (ref 70–99)
Potassium: 3.9 mEq/L (ref 3.5–5.3)
Sodium: 138 mEq/L (ref 135–145)

## 2014-01-27 LAB — LIPID PANEL
CHOL/HDL RATIO: 3.8 ratio
CHOLESTEROL: 165 mg/dL (ref 0–200)
HDL: 44 mg/dL (ref >39)
LDL CALC: 83 mg/dL (ref 0–99)
TRIGLYCERIDES: 191 mg/dL — AB (ref <150)
VLDL: 38 mg/dL (ref 0–40)

## 2014-01-27 LAB — TSH: TSH: 2.39 u[IU]/mL (ref 0.350–4.500)

## 2014-01-28 LAB — MICROALBUMIN, URINE: MICROALB UR: 0.89 mg/dL (ref 0.00–1.89)

## 2014-01-30 ENCOUNTER — Encounter: Payer: Self-pay | Admitting: Family Medicine

## 2014-01-30 ENCOUNTER — Ambulatory Visit (INDEPENDENT_AMBULATORY_CARE_PROVIDER_SITE_OTHER): Payer: 59 | Admitting: Family Medicine

## 2014-01-30 VITALS — BP 122/82 | Ht 60.0 in | Wt 201.0 lb

## 2014-01-30 DIAGNOSIS — R21 Rash and other nonspecific skin eruption: Secondary | ICD-10-CM

## 2014-01-30 MED ORDER — PREDNISONE 10 MG PO TABS: ORAL_TABLET | ORAL | Status: DC
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
## 2014-02-01 ENCOUNTER — Ambulatory Visit (INDEPENDENT_AMBULATORY_CARE_PROVIDER_SITE_OTHER): Payer: 59 | Admitting: Family Medicine

## 2014-02-01 ENCOUNTER — Encounter: Payer: Self-pay | Admitting: Family Medicine

## 2014-02-01 VITALS — BP 138/88 | Ht 60.0 in | Wt 201.0 lb

## 2014-02-01 DIAGNOSIS — R7303 Prediabetes: Secondary | ICD-10-CM

## 2014-02-01 DIAGNOSIS — Z23 Encounter for immunization: Secondary | ICD-10-CM

## 2014-02-01 DIAGNOSIS — I10 Essential (primary) hypertension: Secondary | ICD-10-CM

## 2014-02-01 DIAGNOSIS — R7309 Other abnormal glucose: Secondary | ICD-10-CM

## 2014-02-01 DIAGNOSIS — E785 Hyperlipidemia, unspecified: Secondary | ICD-10-CM

## 2014-02-01 DIAGNOSIS — J45909 Unspecified asthma, uncomplicated: Secondary | ICD-10-CM

## 2014-02-01 LAB — POCT GLYCOSYLATED HEMOGLOBIN (HGB A1C): Hemoglobin A1C: 5.8

## 2014-02-01 MED ORDER — ALPRAZOLAM 1 MG PO TABS: ORAL_TABLET | ORAL | Status: DC

## 2014-02-01 MED ORDER — PRAVASTATIN SODIUM 40 MG PO TABS: 40.0000 mg | ORAL_TABLET | Freq: Every day | ORAL | Status: DC

## 2014-02-01 MED ORDER — OMEPRAZOLE 20 MG PO CPDR: DELAYED_RELEASE_CAPSULE | ORAL | Status: DC

## 2014-02-01 MED ORDER — HYDROCHLOROTHIAZIDE 25 MG PO TABS: ORAL_TABLET | ORAL | Status: DC

## 2014-02-01 MED ORDER — CITALOPRAM HYDROBROMIDE 20 MG PO TABS: ORAL_TABLET | ORAL | Status: DC

## 2014-02-01 MED ORDER — METFORMIN HCL 500 MG PO TABS
ORAL_TABLET | ORAL | Status: DC
Start: 2014-02-01 — End: 2014-09-01

## 2014-02-01 MED ORDER — LISINOPRIL 10 MG PO TABS: ORAL_TABLET | ORAL | Status: DC
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
## 2014-06-06 ENCOUNTER — Telehealth: Payer: Self-pay | Admitting: Family Medicine

## 2014-06-06 MED ORDER — PANTOPRAZOLE SODIUM 40 MG PO TBEC: 40.0000 mg | DELAYED_RELEASE_TABLET | Freq: Every day | ORAL | Status: DC

## 2014-06-06 NOTE — Telephone Encounter (Signed)
Each company can vary what they cover from plan to plan , most likely will cover protonix 40 mg but pt can call her insurance to verify what is or is not covered. They can also tell her which one would cost the least on co-pays. She needs a PPI ( proton pump inhibitor) there are multiple ones that work similar and her insur co may have a preferred one. ( she can call insur co then call us or we can call in protonix)

## 2014-06-06 NOTE — Telephone Encounter (Signed)
Patient said that her insurance is no longer covering omeprazole and wants to know if we can change this medication to something [facility] will cover?   [facility].

## 2014-06-06 NOTE — Telephone Encounter (Signed)
Notified patient each company can vary what they cover from plan to plan , most likely will cover protonix 40 mg but pt can call her insurance to verify what is or is not covered. They can also tell her which one would cost the least on co-pays. She needs a PPI ( proton pump inhibitor) there are multiple ones that work similar and her insur co may have a preferred one. ( she can call insur co then call us or we can call in protonix). Patient will call her insurance and call us back with what they will cover.

## 2014-06-06 NOTE — Telephone Encounter (Signed)
Medication sent to pharmacy. Patient was notified.  

## 2014-06-06 NOTE — Telephone Encounter (Signed)
Patient said that her insurance will cover prilosec 20 or pantoprazole 40 mg.   [facility].

## 2014-07-31 ENCOUNTER — Other Ambulatory Visit: Payer: Self-pay | Admitting: Family Medicine

## 2014-08-08 ENCOUNTER — Telehealth: Payer: Self-pay | Admitting: Family Medicine

## 2014-08-08 DIAGNOSIS — R5383 Other fatigue: Secondary | ICD-10-CM

## 2014-08-08 DIAGNOSIS — I10 Essential (primary) hypertension: Secondary | ICD-10-CM

## 2014-08-08 DIAGNOSIS — E785 Hyperlipidemia, unspecified: Secondary | ICD-10-CM

## 2014-08-08 DIAGNOSIS — R7303 Prediabetes: Secondary | ICD-10-CM

## 2014-08-08 NOTE — Telephone Encounter (Signed)
01/27/14: A1C, TSH, lip, met7, micro protein urine

## 2014-08-08 NOTE — Addendum Note (Signed)
Addended byOneal Deputy: Maxden Naji D on: 08/08/2014 02:23 PM   Modules accepted: Orders

## 2014-08-08 NOTE — Telephone Encounter (Signed)
All the same test minus urine micro-protein

## 2014-08-08 NOTE — Telephone Encounter (Signed)
Patient has med check scheduled for 12/4 and needing lab papers.

## 2014-08-08 NOTE — Telephone Encounter (Signed)
Patient notified and verbalized understanding. 

## 2014-08-15 ENCOUNTER — Other Ambulatory Visit: Payer: Self-pay | Admitting: Family Medicine

## 2014-08-15 NOTE — Telephone Encounter (Signed)
May give this +4 additional refills 

## 2014-08-19 ENCOUNTER — Telehealth: Payer: Self-pay | Admitting: *Deleted

## 2014-08-19 NOTE — Telephone Encounter (Signed)
rx request from [facility] for new rx for ventolin inhaler. Pharm has never filled before. Pt has gotten in the past at [facility]. Med is not on med list or on history. Can she have rx for ventoin inhaler. Last seen for asthma 02/01/14.

## 2014-08-20 NOTE — Telephone Encounter (Signed)
Please send in a prescription for the Ventolin inhaler. 2 puffs every 4 hours when necessary, 4 refills.

## 2014-08-21 ENCOUNTER — Other Ambulatory Visit: Payer: Self-pay | Admitting: *Deleted

## 2014-08-21 ENCOUNTER — Other Ambulatory Visit: Payer: Self-pay | Admitting: Family Medicine

## 2014-08-21 MED ORDER — ALBUTEROL SULFATE HFA 108 (90 BASE) MCG/ACT IN AERS: 2.0000 | INHALATION_SPRAY | RESPIRATORY_TRACT | Status: DC | PRN

## 2014-08-21 NOTE — Telephone Encounter (Signed)
Med sent to pharm 

## 2014-09-01 ENCOUNTER — Ambulatory Visit (INDEPENDENT_AMBULATORY_CARE_PROVIDER_SITE_OTHER): Payer: 59 | Admitting: Family Medicine

## 2014-09-01 VITALS — BP 128/80 | Ht 60.0 in | Wt 194.0 lb

## 2014-09-01 DIAGNOSIS — R7303 Prediabetes: Secondary | ICD-10-CM

## 2014-09-01 DIAGNOSIS — R7309 Other abnormal glucose: Secondary | ICD-10-CM

## 2014-09-01 DIAGNOSIS — I10 Essential (primary) hypertension: Secondary | ICD-10-CM

## 2014-09-01 DIAGNOSIS — E785 Hyperlipidemia, unspecified: Secondary | ICD-10-CM

## 2014-09-01 LAB — POCT GLYCOSYLATED HEMOGLOBIN (HGB A1C): Hemoglobin A1C: 5.7

## 2014-09-01 MED ORDER — METFORMIN HCL 500 MG PO TABS: ORAL_TABLET | ORAL | Status: DC

## 2014-09-01 MED ORDER — PRAVASTATIN SODIUM 40 MG PO TABS: 40.0000 mg | ORAL_TABLET | Freq: Every day | ORAL | Status: DC

## 2014-09-01 MED ORDER — LISINOPRIL 10 MG PO TABS: ORAL_TABLET | ORAL | Status: DC

## 2014-09-01 MED ORDER — CITALOPRAM HYDROBROMIDE 20 MG PO TABS: ORAL_TABLET | ORAL | Status: DC

## 2014-09-01 MED ORDER — ESOMEPRAZOLE MAGNESIUM 40 MG PO CPDR: 40.0000 mg | DELAYED_RELEASE_CAPSULE | Freq: Every day | ORAL | Status: DC

## 2014-09-01 MED ORDER — HYDROCHLOROTHIAZIDE 25 MG PO TABS: ORAL_TABLET | ORAL | Status: DC

## 2014-09-01 NOTE — Progress Notes (Signed)
   Subjective:    Patient ID: Courtney Hunter, female    DOB: 1965/10/02, 48 y.o.   MRN: [id number]  HPI  The patient was seen today as part of a comprehensive diabetic check up. The patient had the following elements completed: -Review of medication compliance -Review of glucose monitoring results -Review of any complications do to high or low sugars -Diabetic foot exam was completed as part of today's visit. The following was also discussed: -Importance of yearly eye exams -Importance of following diabetic/low sugar-starch diet -Importance of exercise and regular activity -Importance of regular followup visits. -Most recent hemoglobin A1c were reviewed with the patient along with goals regarding diabetes.  Pt trying hard with low salt diet actvie to a degree rarley checks sugar Takers m,eds Review of Systems  Constitutional: Negative for activity change, appetite change and fatigue.  HENT: Negative for congestion.   Respiratory: Negative for cough and chest tightness.   Cardiovascular: Negative for chest pain and palpitations.  Gastrointestinal: Negative for abdominal pain.  Endocrine: Negative for polydipsia and polyphagia.  Genitourinary: Negative for frequency.  Neurological: Negative for weakness.  Psychiatric/Behavioral: Negative for confusion.       Objective:   Physical Exam  Constitutional: She appears well-nourished. No distress.  Cardiovascular: Normal rate, regular rhythm and normal heart sounds.   No murmur heard. Pulmonary/Chest: Effort normal and breath sounds normal. No respiratory distress.  Musculoskeletal: She exhibits no edema.  Lymphadenopathy:    She has no cervical adenopathy.  Neurological: She is alert. She exhibits normal muscle tone.  Psychiatric: Her behavior is normal.  Vitals reviewed.  States refux not good with protonix will try something else, insurance wont pay for omeprazole       Assessment & Plan:  HTN srtable predm-  good hyperlipid-continue meds Recheck 6 months  Change reflux meds if doesn't help pt to call

## 2014-09-04 ENCOUNTER — Encounter: Payer: Self-pay | Admitting: Family Medicine

## 2014-09-04 ENCOUNTER — Other Ambulatory Visit: Payer: Self-pay | Admitting: *Deleted

## 2014-09-04 MED ORDER — TRETINOIN 0.025 % EX CREA: TOPICAL_CREAM | Freq: Every day | CUTANEOUS | Status: DC

## 2014-09-20 ENCOUNTER — Telehealth: Payer: Self-pay | Admitting: Family Medicine

## 2014-09-20 NOTE — Telephone Encounter (Signed)
Rx prior auth APPROVED for pt's tretinoin (RETIN-A) 0.025 % cream, expires 09/21/15 through [employer], faxed approval to [facility], LMOM to notify pt of approval

## 2014-09-28 ENCOUNTER — Other Ambulatory Visit: Payer: Self-pay | Admitting: Family Medicine

## 2014-11-23 ENCOUNTER — Other Ambulatory Visit: Payer: Self-pay | Admitting: Family Medicine

## 2014-12-05 ENCOUNTER — Other Ambulatory Visit: Payer: Self-pay

## 2014-12-06 LAB — CYTOLOGY - PAP

## 2014-12-19 ENCOUNTER — Other Ambulatory Visit: Payer: Self-pay | Admitting: Obstetrics & Gynecology

## 2015-01-04 ENCOUNTER — Other Ambulatory Visit: Payer: Self-pay | Admitting: Family Medicine

## 2015-01-10 ENCOUNTER — Other Ambulatory Visit: Payer: Self-pay | Admitting: Family Medicine

## 2015-01-10 NOTE — Telephone Encounter (Signed)
She may have this +3 additional refills 

## 2015-02-07 ENCOUNTER — Telehealth: Payer: Self-pay | Admitting: Family Medicine

## 2015-02-07 DIAGNOSIS — I10 Essential (primary) hypertension: Secondary | ICD-10-CM

## 2015-02-07 DIAGNOSIS — E785 Hyperlipidemia, unspecified: Secondary | ICD-10-CM

## 2015-02-07 DIAGNOSIS — R7303 Prediabetes: Secondary | ICD-10-CM

## 2015-02-07 DIAGNOSIS — R5383 Other fatigue: Secondary | ICD-10-CM

## 2015-02-07 NOTE — Telephone Encounter (Signed)
Lipid, liver, metabolic 7, hemoglobin A1c, urine micro-protein, vitamin D

## 2015-02-07 NOTE — Telephone Encounter (Signed)
Patient requesting order for blood work. °

## 2015-02-08 NOTE — Telephone Encounter (Signed)
Blood work orders placed in EPIC. Patient notified. 

## 2015-02-23 LAB — HEPATIC FUNCTION PANEL
ALT: 39 IU/L — ABNORMAL HIGH (ref 0–32)
AST: 34 IU/L (ref 0–40)
Albumin: 4.5 g/dL (ref 3.5–5.5)
Alkaline Phosphatase: 55 IU/L (ref 39–117)
BILIRUBIN TOTAL: 0.3 mg/dL (ref 0.0–1.2)
Bilirubin, Direct: 0.13 mg/dL (ref 0.00–0.40)
TOTAL PROTEIN: 7.3 g/dL (ref 6.0–8.5)

## 2015-02-23 LAB — BASIC METABOLIC PANEL
BUN/Creatinine Ratio: 15 (ref 9–23)
BUN: 14 mg/dL (ref 6–24)
CO2: 25 mmol/L (ref 18–29)
Calcium: 10.1 mg/dL (ref 8.7–10.2)
Chloride: 98 mmol/L (ref 97–108)
Creatinine, Ser: 0.94 mg/dL (ref 0.57–1.00)
GFR calc Af Amer: 82 mL/min/{1.73_m2} (ref >59)
GFR calc non Af Amer: 71 mL/min/{1.73_m2} (ref >59)
Glucose: 115 mg/dL — ABNORMAL HIGH (ref 65–99)
POTASSIUM: 4.2 mmol/L (ref 3.5–5.2)
Sodium: 140 mmol/L (ref 134–144)

## 2015-02-23 LAB — LIPID PANEL
CHOL/HDL RATIO: 3.3 ratio (ref 0.0–4.4)
Cholesterol, Total: 160 mg/dL (ref 100–199)
HDL: 49 mg/dL (ref >39)
LDL CALC: 80 mg/dL (ref 0–99)
Triglycerides: 154 mg/dL — ABNORMAL HIGH (ref 0–149)
VLDL CHOLESTEROL CAL: 31 mg/dL (ref 5–40)

## 2015-02-23 LAB — VITAMIN D 25 HYDROXY (VIT D DEFICIENCY, FRACTURES): Vit D, 25-Hydroxy: 43.6 ng/mL (ref 30.0–100.0)

## 2015-02-23 LAB — HEMOGLOBIN A1C
Est. average glucose Bld gHb Est-mCnc: 126 mg/dL
Hgb A1c MFr Bld: 6 % — ABNORMAL HIGH (ref 4.8–5.6)

## 2015-02-23 LAB — MICROALBUMIN, URINE: Microalbumin, Urine: 10.1 ug/mL

## 2015-02-28 ENCOUNTER — Ambulatory Visit (INDEPENDENT_AMBULATORY_CARE_PROVIDER_SITE_OTHER): Payer: 59 | Admitting: Family Medicine

## 2015-02-28 ENCOUNTER — Encounter: Payer: Self-pay | Admitting: Family Medicine

## 2015-02-28 VITALS — BP 128/84 | Ht 60.0 in | Wt 191.0 lb

## 2015-02-28 DIAGNOSIS — E785 Hyperlipidemia, unspecified: Secondary | ICD-10-CM | POA: Diagnosis not present

## 2015-02-28 DIAGNOSIS — I10 Essential (primary) hypertension: Secondary | ICD-10-CM | POA: Diagnosis not present

## 2015-02-28 DIAGNOSIS — K219 Gastro-esophageal reflux disease without esophagitis: Secondary | ICD-10-CM

## 2015-02-28 DIAGNOSIS — R7309 Other abnormal glucose: Secondary | ICD-10-CM

## 2015-02-28 DIAGNOSIS — R7303 Prediabetes: Secondary | ICD-10-CM

## 2015-02-28 NOTE — Progress Notes (Signed)
   Subjective:    Patient ID: Courtney HsuHarriet S Hunter, female    DOB: [birth year], 49 y.o.   MRN: [id number]  Hyperlipidemia This is a chronic problem. The current episode started more than 1 year ago. Pertinent negatives include no chest pain.  Had bloodwork on 5/26. Prediabetic A1C 6.0 on bloodwork.    pain on left side. Pt states she has had this pain for years. Some days worse than others. Always there. In the LLQ. Had colon 4 years ago was normal.no blood. Occasionally worse with position change. Does not awaken at night. BM formed. Sometimes improves with BM.  pt low level of pain. no wt loss. No fevers.  Discomfort more of annoyance not specific pain She had colonoscopy about 4 years ago.   We discussed diabetes importance of watching diet staying physically active we also discussed blood pressure and minimizing salt use and discuss reflux. Her mental health overall doing well. Using her medicines correctly.  Review of Systems  Constitutional: Negative for activity change, appetite change and fatigue.  HENT: Negative for congestion.   Respiratory: Negative for cough.   Cardiovascular: Negative for chest pain.  Gastrointestinal: Negative for abdominal pain.  Endocrine: Negative for polydipsia and polyphagia.  Neurological: Negative for weakness.  Psychiatric/Behavioral: Negative for confusion.       Objective:   Physical Exam  Constitutional: She appears well-nourished. No distress.  Cardiovascular: Normal rate, regular rhythm and normal heart sounds.   No murmur heard. Pulmonary/Chest: Effort normal and breath sounds normal. No respiratory distress.  Musculoskeletal: She exhibits no edema.  Lymphadenopathy:    She has no cervical adenopathy.  Neurological: She is alert. She exhibits normal muscle tone.  Psychiatric: Her behavior is normal.  Vitals reviewed.         Assessment & Plan:  Hyperlipidemia the importance of watching diet discussed importance of losing  weight Slight elevation of liver enzymes the importance of losing weight was discussed Blood pressure under good control continue current measures watch diet closely Lower pelvic pain I believe this is more musculoskeletal. I would not recommend further testing stretching exercises recommended if ongoing trouble referral to physical medicine specialist  I do recommend the next time she is in with her gynecologist that she discuss this lower left inguinal pain to make sure that it is not related to the ovary. I doubt that it is  Prediabetes continue the metformin Reduce omeprazole important to keep GERD symptoms under control but at the same time reducing of omeprazole will help. 25 minutes spent with patient greater than half in discussion of multiple issues.

## 2015-03-07 ENCOUNTER — Encounter: Payer: Self-pay | Admitting: Family Medicine

## 2015-03-07 ENCOUNTER — Ambulatory Visit (INDEPENDENT_AMBULATORY_CARE_PROVIDER_SITE_OTHER): Payer: 59 | Admitting: Family Medicine

## 2015-03-07 VITALS — BP 114/80 | Temp 99.8°F | Ht 60.0 in | Wt 187.5 lb

## 2015-03-07 DIAGNOSIS — J029 Acute pharyngitis, unspecified: Secondary | ICD-10-CM

## 2015-03-07 DIAGNOSIS — I889 Nonspecific lymphadenitis, unspecified: Secondary | ICD-10-CM

## 2015-03-07 LAB — POCT RAPID STREP A (OFFICE): Rapid Strep A Screen: NEGATIVE

## 2015-03-07 MED ORDER — AZITHROMYCIN 250 MG PO TABS: ORAL_TABLET | ORAL | Status: DC

## 2015-03-07 NOTE — Progress Notes (Signed)
   Subjective:    Patient ID: Courtney Hunter, female    DOB: Jun 14, 1966, 49 y.o.   MRN: [id number]  Sore Throat  This is a new problem. The current episode started in the past 7 days. The problem has been unchanged. Neither side of throat is experiencing more pain than the other. The maximum temperature recorded prior to her arrival was 100.4 - 100.9 F. The pain is moderate. She has tried acetaminophen and NSAIDs (Benadryl) for the symptoms. The treatment provided no relief.    Sunday when out doors  Throat started itching  Then i to sore   Right side seemed swolllen  Fever and inflammed   100.1 temp   Still took benadryl and tendency for eyes to swell  Some headache light in nature   Glands swollen and tendr   Results for orders placed or performed in visit on 03/07/15  POCT rapid strep A  Result Value Ref Range   Rapid Strep A Screen Negative Negative          Review of Systems No vomiting no diarrhea no rash some headache frontal in nature.    Objective:   Physical Exam Alert vitals stable mild malaise. Mild nasal congestion pharynx very erythematous. Tender anterior nodes. Lungs clear. Heart regular in rhythm.       Assessment & Plan:  Impression bilateral cervical lymphadenitis with pharyngitis could be viral could be bacterial. Plan advised prescribed. Symptom care discussed warning signs discussed WSL

## 2015-03-08 LAB — STREP A DNA PROBE: Strep Gp A Direct, DNA Probe: NEGATIVE

## 2015-03-13 ENCOUNTER — Other Ambulatory Visit: Payer: Self-pay | Admitting: Family Medicine

## 2015-03-20 ENCOUNTER — Other Ambulatory Visit: Payer: Self-pay | Admitting: Family Medicine

## 2015-04-04 ENCOUNTER — Other Ambulatory Visit: Payer: Self-pay | Admitting: Family Medicine

## 2015-04-19 ENCOUNTER — Other Ambulatory Visit: Payer: Self-pay | Admitting: Family Medicine

## 2015-04-24 ENCOUNTER — Other Ambulatory Visit: Payer: Self-pay | Admitting: Family Medicine

## 2015-05-07 ENCOUNTER — Other Ambulatory Visit: Payer: Self-pay | Admitting: Family Medicine

## 2015-05-07 NOTE — Telephone Encounter (Signed)
May refill this +4 additional refills 

## 2015-06-20 ENCOUNTER — Other Ambulatory Visit: Payer: Self-pay | Admitting: Family Medicine

## 2015-07-05 LAB — HM DIABETES EYE EXAM

## 2015-07-09 ENCOUNTER — Other Ambulatory Visit: Payer: Self-pay | Admitting: Family Medicine

## 2015-08-16 ENCOUNTER — Telehealth: Payer: Self-pay | Admitting: Family Medicine

## 2015-08-16 DIAGNOSIS — R7303 Prediabetes: Secondary | ICD-10-CM

## 2015-08-16 DIAGNOSIS — Z79899 Other long term (current) drug therapy: Secondary | ICD-10-CM

## 2015-08-16 DIAGNOSIS — I10 Essential (primary) hypertension: Secondary | ICD-10-CM

## 2015-08-16 DIAGNOSIS — E785 Hyperlipidemia, unspecified: Secondary | ICD-10-CM

## 2015-08-16 NOTE — Telephone Encounter (Signed)
Called patient and informed her per Dr.Scott Luking- Labs were ordered. Patient verbalized understanding.

## 2015-08-16 NOTE — Telephone Encounter (Signed)
Pt has appt coming 12/1 an wants to order her blood work  Last labs 02/22/15 Vit D 25, microalbumin urine A1C, BMP, Hep, Lip

## 2015-08-16 NOTE — Telephone Encounter (Signed)
Lipid, liver, hemoglobin A1c, metabolic 7 

## 2015-08-30 ENCOUNTER — Ambulatory Visit: Payer: 59 | Admitting: Family Medicine

## 2015-08-30 LAB — HEPATIC FUNCTION PANEL
ALK PHOS: 53 IU/L (ref 39–117)
ALT: 34 IU/L — ABNORMAL HIGH (ref 0–32)
AST: 28 IU/L (ref 0–40)
Albumin: 4.3 g/dL (ref 3.5–5.5)
Bilirubin Total: 0.2 mg/dL (ref 0.0–1.2)
Bilirubin, Direct: 0.07 mg/dL (ref 0.00–0.40)
Total Protein: 7 g/dL (ref 6.0–8.5)

## 2015-08-30 LAB — BASIC METABOLIC PANEL
BUN/Creatinine Ratio: 13 (ref 9–23)
BUN: 11 mg/dL (ref 6–24)
CO2: 26 mmol/L (ref 18–29)
Calcium: 9.4 mg/dL (ref 8.7–10.2)
Chloride: 102 mmol/L (ref 97–106)
Creatinine, Ser: 0.85 mg/dL (ref 0.57–1.00)
GFR calc Af Amer: 92 mL/min/{1.73_m2} (ref >59)
GFR calc non Af Amer: 80 mL/min/{1.73_m2} (ref >59)
Glucose: 116 mg/dL — ABNORMAL HIGH (ref 65–99)
Potassium: 3.8 mmol/L (ref 3.5–5.2)
Sodium: 141 mmol/L (ref 136–144)

## 2015-08-30 LAB — LIPID PANEL
CHOLESTEROL TOTAL: 178 mg/dL (ref 100–199)
Chol/HDL Ratio: 4 ratio units (ref 0.0–4.4)
HDL: 45 mg/dL (ref >39)
LDL CALC: 97 mg/dL (ref 0–99)
Triglycerides: 182 mg/dL — ABNORMAL HIGH (ref 0–149)
VLDL CHOLESTEROL CAL: 36 mg/dL (ref 5–40)

## 2015-08-30 LAB — HEMOGLOBIN A1C
Est. average glucose Bld gHb Est-mCnc: 131 mg/dL
Hgb A1c MFr Bld: 6.2 % — ABNORMAL HIGH (ref 4.8–5.6)

## 2015-09-04 ENCOUNTER — Ambulatory Visit (INDEPENDENT_AMBULATORY_CARE_PROVIDER_SITE_OTHER): Payer: 59 | Admitting: Family Medicine

## 2015-09-04 ENCOUNTER — Encounter: Payer: Self-pay | Admitting: Family Medicine

## 2015-09-04 VITALS — BP 128/84 | Ht 60.0 in | Wt 193.0 lb

## 2015-09-04 DIAGNOSIS — R7303 Prediabetes: Secondary | ICD-10-CM

## 2015-09-04 DIAGNOSIS — E785 Hyperlipidemia, unspecified: Secondary | ICD-10-CM

## 2015-09-04 DIAGNOSIS — I10 Essential (primary) hypertension: Secondary | ICD-10-CM

## 2015-09-04 DIAGNOSIS — G4700 Insomnia, unspecified: Secondary | ICD-10-CM

## 2015-09-04 MED ORDER — ALPRAZOLAM 1 MG PO TABS: ORAL_TABLET | ORAL | Status: DC

## 2015-09-04 MED ORDER — METFORMIN HCL 500 MG PO TABS: ORAL_TABLET | ORAL | Status: DC

## 2015-09-04 NOTE — Progress Notes (Signed)
   Subjective:    Patient ID: Courtney Hunter, female    DOB: 06-19-1966, 49 y.o.   MRN: [id number]  Diabetes She presents for her follow-up diabetic visit. She has type 2 diabetes mellitus. Pertinent negatives for hypoglycemia include no confusion. Pertinent negatives for diabetes include no chest pain, no fatigue, no polydipsia, no polyphagia and no weakness. Current diabetic treatment includes oral agent (monotherapy) (metformin). She is compliant with treatment all of the time. Eye exam is current.  has a new meter but it is not working properly. So she has not been checking bs.  A1C on bloodwork. 6.2  Pain in right middle finger. Injury in may. Has a knot on it. Patient states she injured it about 6 months ago but she is noticed that there is been a small knot near with some redness in aches when she overuses it. She does have a family history rheumatoid arthritis but she does not have any wrist elbow shoulder or low back pain.  Patient relates that her allergies been doing well but when she is around her daughter's dogs she does have some wheezing she uses albuterol for this She does take her blood pressure medicine on a regular basis denies trouble watch his diet  She is trying to lose weight by watching diet but not exercising quite as much is she should  Patient is taking her cholesterol medicine try to watch diet to some degree recently had lab work completed    Review of Systems  Constitutional: Negative for activity change, appetite change and fatigue.  HENT: Negative for congestion.   Respiratory: Negative for cough.   Cardiovascular: Negative for chest pain.  Gastrointestinal: Negative for abdominal pain.  Endocrine: Negative for polydipsia and polyphagia.  Neurological: Negative for weakness.  Psychiatric/Behavioral: Negative for confusion.       Objective:   Physical Exam  Constitutional: She appears well-nourished. No distress.  Cardiovascular: Normal rate,  regular rhythm and normal heart sounds.   No murmur heard. Pulmonary/Chest: Effort normal and breath sounds normal. No respiratory distress.  Musculoskeletal: She exhibits no edema.  Lymphadenopathy:    She has no cervical adenopathy.  Neurological: She is alert. She exhibits normal muscle tone.  Psychiatric: Her behavior is normal.  Vitals reviewed.    25 minutes was spent with the patient. Greater than half the time was spent in discussion and answering questions and counseling regarding the issues that the patient came in for today.      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

## 2015-09-20 ENCOUNTER — Other Ambulatory Visit: Payer: Self-pay | Admitting: Family Medicine

## 2015-09-20 NOTE — Telephone Encounter (Signed)
Ok in scotts name for six months

## 2015-11-07 ENCOUNTER — Other Ambulatory Visit: Payer: Self-pay | Admitting: Family Medicine

## 2015-11-28 ENCOUNTER — Other Ambulatory Visit: Payer: Self-pay | Admitting: Family Medicine

## 2015-12-19 ENCOUNTER — Other Ambulatory Visit: Payer: Self-pay | Admitting: Family Medicine

## 2016-01-01 ENCOUNTER — Other Ambulatory Visit: Payer: Self-pay | Admitting: Surgery

## 2016-01-01 DIAGNOSIS — R1032 Left lower quadrant pain: Secondary | ICD-10-CM

## 2016-01-08 ENCOUNTER — Ambulatory Visit
Admission: RE | Admit: 2016-01-08 | Discharge: 2016-01-08 | Disposition: A | Discharge status: Home / Self Care | Payer: 59 | Source: Ambulatory Visit | Attending: Surgery | Admitting: Surgery

## 2016-01-08 DIAGNOSIS — R1032 Left lower quadrant pain: Secondary | ICD-10-CM

## 2016-01-08 MED ORDER — IOPAMIDOL (ISOVUE-300) INJECTION 61%
100.0000 mL | Freq: Once | INTRAVENOUS | Status: AC | PRN
  Administered 2016-01-08: 100 mL via INTRAVENOUS

## 2016-02-19 ENCOUNTER — Telehealth: Payer: Self-pay | Admitting: Family Medicine

## 2016-02-19 DIAGNOSIS — R5383 Other fatigue: Secondary | ICD-10-CM

## 2016-02-19 DIAGNOSIS — E119 Type 2 diabetes mellitus without complications: Secondary | ICD-10-CM

## 2016-02-19 DIAGNOSIS — Z79899 Other long term (current) drug therapy: Secondary | ICD-10-CM

## 2016-02-19 DIAGNOSIS — E785 Hyperlipidemia, unspecified: Secondary | ICD-10-CM

## 2016-02-19 NOTE — Telephone Encounter (Signed)
I recommend CBC, lipid, liver, metabolic 7, hemoglobin A1c, urine ACR

## 2016-02-19 NOTE — Telephone Encounter (Signed)
Patient requesting order for blood work for upcoming appt in June.

## 2016-02-20 NOTE — Telephone Encounter (Signed)
Bloodwork ordered. Patient was notified.  

## 2016-03-04 ENCOUNTER — Ambulatory Visit (INDEPENDENT_AMBULATORY_CARE_PROVIDER_SITE_OTHER): Payer: 59 | Admitting: Family Medicine

## 2016-03-04 ENCOUNTER — Encounter: Payer: Self-pay | Admitting: Family Medicine

## 2016-03-04 VITALS — BP 128/72 | Ht 60.0 in | Wt 191.4 lb

## 2016-03-04 DIAGNOSIS — R7303 Prediabetes: Secondary | ICD-10-CM

## 2016-03-04 DIAGNOSIS — I10 Essential (primary) hypertension: Secondary | ICD-10-CM

## 2016-03-04 DIAGNOSIS — G4700 Insomnia, unspecified: Secondary | ICD-10-CM | POA: Diagnosis not present

## 2016-03-04 DIAGNOSIS — E785 Hyperlipidemia, unspecified: Secondary | ICD-10-CM

## 2016-03-04 NOTE — Progress Notes (Signed)
   Subjective:    Patient ID: Courtney Hunter, female    DOB: 08-Aug-1966, 50 y.o.   MRN: [id number]  Hypertension This is a chronic problem. The current episode started more than 1 year ago. Pertinent negatives include no chest pain. There are no compliance problems.   We are long discussion regarding her diabetes importance keeping her sugar under control importance of watching diet exercise and trying to lose weight she denies any excessively high sugar spells Had a long discussion regarding toenail fungus and whether or not to treat it. She is opting toward treating it we discussed liver treatment in regards to testing the liver We also discussed compliance with blood pressure medicine and also the importance of watching diet low-salt regular physical activity Discuss Xanax for sleep and also discuss Celexa is doing well for her moods stress levels good Cholesterol was discussed including recent lab work in the importance of keeping her numbers under good control taking the medication she denies any side effects. Patient is doing her lab work we will follow her up with her regarding this  Patient has concerns of foot fungus and starting medicatoin prescribed by Podiatrist.Dr Pricilla Holmucker  Review of Systems  Constitutional: Negative for activity change, appetite change and fatigue.  HENT: Negative for congestion.   Respiratory: Negative for cough.   Cardiovascular: Negative for chest pain.  Gastrointestinal: Negative for abdominal pain.  Endocrine: Negative for polydipsia and polyphagia.  Neurological: Negative for weakness.  Psychiatric/Behavioral: Negative for confusion.       Objective:   Physical Exam  Constitutional: She appears well-nourished. No distress.  Cardiovascular: Normal rate, regular rhythm and normal heart sounds.   No murmur heard. Pulmonary/Chest: Effort normal and breath sounds normal. No respiratory distress.  Musculoskeletal: She exhibits no edema.    Lymphadenopathy:    She has no cervical adenopathy.  Neurological: She is alert. She exhibits normal muscle tone.  Psychiatric: Her behavior is normal.  Vitals reviewed.   Greater than 25 minutes spent with patient discussing multiple issues      Assessment & Plan:  Patient unable to do her blood work recently therefore she will get it done this week await the results of this may need to change medications based upon this  Metformin occasionally causing loose stools currently she would like to stick with current dose but she will entertain the thought of trying an extended release medication  She is trying to lose weight through exercise watching diet but she states she could be doing a better job  Blood pressure good control continue current measures  Toenail fungus she will wait till her liver functions come back if these are normal she will start on the medication or podiatrist prescribed I told her she ought to check her liver functions monthly while on this medicine and she states the podiatrist one in her on the medicine for 60 days  Hyperlipidemia recent lab work reviewed await the results of current lab work continue the medication watch diet closely  Metabolic syndrome patient encouraged to lose weight and stay physically active  If all goes well follow-up 6 months

## 2016-03-06 LAB — LIPID PANEL
Chol/HDL Ratio: 3.6 ratio units (ref 0.0–4.4)
Cholesterol, Total: 177 mg/dL (ref 100–199)
HDL: 49 mg/dL (ref >39)
LDL Calculated: 93 mg/dL (ref 0–99)
Triglycerides: 177 mg/dL — ABNORMAL HIGH (ref 0–149)
VLDL CHOLESTEROL CAL: 35 mg/dL (ref 5–40)

## 2016-03-06 LAB — CBC WITH DIFFERENTIAL/PLATELET
Basophils Absolute: 0.1 10*3/uL (ref 0.0–0.2)
Basos: 1 %
EOS (ABSOLUTE): 0.1 10*3/uL (ref 0.0–0.4)
Eos: 2 %
HEMATOCRIT: 41 % (ref 34.0–46.6)
Hemoglobin: 13.7 g/dL (ref 11.1–15.9)
IMMATURE GRANS (ABS): 0 10*3/uL (ref 0.0–0.1)
IMMATURE GRANULOCYTES: 0 %
LYMPHS: 36 %
Lymphocytes Absolute: 2.2 10*3/uL (ref 0.7–3.1)
MCH: 31.5 pg (ref 26.6–33.0)
MCHC: 33.4 g/dL (ref 31.5–35.7)
MCV: 94 fL (ref 79–97)
MONOS ABS: 0.6 10*3/uL (ref 0.1–0.9)
Monocytes: 10 %
NEUTROS PCT: 51 %
Neutrophils Absolute: 3.2 10*3/uL (ref 1.4–7.0)
PLATELETS: 358 10*3/uL (ref 150–379)
RBC: 4.35 x10E6/uL (ref 3.77–5.28)
RDW: 12.7 % (ref 12.3–15.4)
WBC: 6.3 10*3/uL (ref 3.4–10.8)

## 2016-03-06 LAB — BASIC METABOLIC PANEL
BUN/Creatinine Ratio: 18 (ref 9–23)
BUN: 16 mg/dL (ref 6–24)
CALCIUM: 9.9 mg/dL (ref 8.7–10.2)
CO2: 26 mmol/L (ref 18–29)
Chloride: 98 mmol/L (ref 96–106)
Creatinine, Ser: 0.89 mg/dL (ref 0.57–1.00)
GFR, EST AFRICAN AMERICAN: 87 mL/min/{1.73_m2} (ref >59)
GFR, EST NON AFRICAN AMERICAN: 75 mL/min/{1.73_m2} (ref >59)
Glucose: 99 mg/dL (ref 65–99)
Potassium: 4 mmol/L (ref 3.5–5.2)
Sodium: 140 mmol/L (ref 134–144)

## 2016-03-06 LAB — MICROALBUMIN / CREATININE URINE RATIO
CREATININE, UR: 156.4 mg/dL
MICROALB/CREAT RATIO: 5.5 mg/g{creat} (ref 0.0–30.0)
MICROALBUM., U, RANDOM: 8.6 ug/mL

## 2016-03-06 LAB — HEPATIC FUNCTION PANEL
ALBUMIN: 4.4 g/dL (ref 3.5–5.5)
ALT: 28 IU/L (ref 0–32)
AST: 25 IU/L (ref 0–40)
Alkaline Phosphatase: 59 IU/L (ref 39–117)
BILIRUBIN TOTAL: 0.3 mg/dL (ref 0.0–1.2)
Bilirubin, Direct: 0.08 mg/dL (ref 0.00–0.40)
Total Protein: 7.5 g/dL (ref 6.0–8.5)

## 2016-03-06 LAB — HEMOGLOBIN A1C
Est. average glucose Bld gHb Est-mCnc: 117 mg/dL
Hgb A1c MFr Bld: 5.7 % — ABNORMAL HIGH (ref 4.8–5.6)

## 2016-03-18 ENCOUNTER — Other Ambulatory Visit: Payer: Self-pay | Admitting: Family Medicine

## 2016-03-24 ENCOUNTER — Other Ambulatory Visit: Payer: Self-pay | Admitting: Family Medicine

## 2016-03-31 ENCOUNTER — Other Ambulatory Visit: Payer: Self-pay | Admitting: *Deleted

## 2016-03-31 MED ORDER — ALPRAZOLAM 1 MG PO TABS: ORAL_TABLET | ORAL | Status: DC

## 2016-03-31 NOTE — Progress Notes (Signed)
May have this and 3 refills 

## 2016-05-09 ENCOUNTER — Other Ambulatory Visit: Payer: Self-pay | Admitting: Family Medicine

## 2016-05-19 ENCOUNTER — Other Ambulatory Visit: Payer: Self-pay | Admitting: Family Medicine

## 2016-06-05 ENCOUNTER — Other Ambulatory Visit: Payer: Self-pay | Admitting: Family Medicine

## 2016-07-15 ENCOUNTER — Encounter: Payer: Self-pay | Admitting: Family Medicine

## 2016-07-15 ENCOUNTER — Ambulatory Visit (INDEPENDENT_AMBULATORY_CARE_PROVIDER_SITE_OTHER): Payer: 59 | Admitting: Family Medicine

## 2016-07-15 VITALS — BP 120/76 | Temp 98.6°F | Ht 60.0 in | Wt 173.1 lb

## 2016-07-15 DIAGNOSIS — B309 Viral conjunctivitis, unspecified: Secondary | ICD-10-CM

## 2016-07-15 DIAGNOSIS — B9689 Other specified bacterial agents as the cause of diseases classified elsewhere: Secondary | ICD-10-CM | POA: Diagnosis not present

## 2016-07-15 DIAGNOSIS — R7303 Prediabetes: Secondary | ICD-10-CM

## 2016-07-15 DIAGNOSIS — J019 Acute sinusitis, unspecified: Secondary | ICD-10-CM | POA: Diagnosis not present

## 2016-07-15 DIAGNOSIS — E785 Hyperlipidemia, unspecified: Secondary | ICD-10-CM

## 2016-07-15 DIAGNOSIS — Z79899 Other long term (current) drug therapy: Secondary | ICD-10-CM

## 2016-07-15 MED ORDER — AMOXICILLIN-POT CLAVULANATE 875-125 MG PO TABS
1.0000 | ORAL_TABLET | Freq: Two times a day (BID) | ORAL | 0 refills | Status: DC
Start: 2016-07-15 — End: 2016-09-02

## 2016-07-15 NOTE — Progress Notes (Signed)
   Subjective:    Patient ID: Courtney Hunter, female    DOB: 04-Feb-1966, 50 y.o.   MRN: [id number]  Fever   This is a new problem. The current episode started in the past 7 days. The problem occurs intermittently. The problem has been unchanged. The maximum temperature noted was 100 to 100.9 F. Associated symptoms include congestion, coughing, headaches and a sore throat. Pertinent negatives include no chest pain, ear pain or wheezing. She has tried acetaminophen (Nyquil) for the symptoms. The treatment provided no relief.   Patient has no other concerns at this time.  She relates she's had a rough for several days with some low-grade fever head congestion sinus pain she's also having crusting in both eyes over the past couple days her grandson had a similar sickness recently   Review of Systems  Constitutional: Positive for fever. Negative for activity change.  HENT: Positive for congestion, rhinorrhea and sore throat. Negative for ear pain.   Eyes: Positive for discharge and redness.  Respiratory: Positive for cough. Negative for shortness of breath and wheezing.   Cardiovascular: Negative for chest pain.  Neurological: Positive for headaches.       Objective:   Physical Exam  Constitutional: She appears well-developed.  HENT:  Head: Normocephalic.  Nose: Nose normal.  Mouth/Throat: Oropharynx is clear and moist. No oropharyngeal exudate.  Neck: Neck supple.  Cardiovascular: Normal rate and normal heart sounds.   No murmur heard. Pulmonary/Chest: Effort normal and breath sounds normal. She has no wheezes.  Lymphadenopathy:    She has no cervical adenopathy.  Skin: Skin is warm and dry.  Nursing note and vitals reviewed.         Assessment & Plan:  Viral syndrome Secondary rhinosinusitis Antibiotic prescribed warning signs discussed Follow-up if ongoing troubles Lab work indicated toward the end of this year with follow-up office visit

## 2016-08-11 ENCOUNTER — Other Ambulatory Visit: Payer: Self-pay

## 2016-08-11 MED ORDER — ALPRAZOLAM 1 MG PO TABS: ORAL_TABLET | ORAL | 4 refills | Status: DC

## 2016-08-11 NOTE — Progress Notes (Signed)
May give this plus four additional refills

## 2016-08-26 ENCOUNTER — Encounter: Payer: Self-pay | Admitting: Family Medicine

## 2016-08-28 LAB — BASIC METABOLIC PANEL
BUN/Creatinine Ratio: 16 (ref 9–23)
BUN: 15 mg/dL (ref 6–24)
CALCIUM: 9.8 mg/dL (ref 8.7–10.2)
CHLORIDE: 98 mmol/L (ref 96–106)
CO2: 28 mmol/L (ref 18–29)
Creatinine, Ser: 0.94 mg/dL (ref 0.57–1.00)
GFR, EST AFRICAN AMERICAN: 81 mL/min/{1.73_m2} (ref >59)
GFR, EST NON AFRICAN AMERICAN: 70 mL/min/{1.73_m2} (ref >59)
Glucose: 105 mg/dL — ABNORMAL HIGH (ref 65–99)
POTASSIUM: 4.2 mmol/L (ref 3.5–5.2)
SODIUM: 140 mmol/L (ref 134–144)

## 2016-08-28 LAB — LIPID PANEL
CHOLESTEROL TOTAL: 157 mg/dL (ref 100–199)
Chol/HDL Ratio: 3.5 ratio units (ref 0.0–4.4)
HDL: 45 mg/dL (ref >39)
LDL CALC: 75 mg/dL (ref 0–99)
TRIGLYCERIDES: 184 mg/dL — AB (ref 0–149)
VLDL CHOLESTEROL CAL: 37 mg/dL (ref 5–40)

## 2016-08-28 LAB — HEPATIC FUNCTION PANEL
ALBUMIN: 4.4 g/dL (ref 3.5–5.5)
ALK PHOS: 62 IU/L (ref 39–117)
ALT: 26 IU/L (ref 0–32)
AST: 25 IU/L (ref 0–40)
BILIRUBIN TOTAL: 0.4 mg/dL (ref 0.0–1.2)
Bilirubin, Direct: 0.12 mg/dL (ref 0.00–0.40)
Total Protein: 7 g/dL (ref 6.0–8.5)

## 2016-08-28 LAB — HEMOGLOBIN A1C
Est. average glucose Bld gHb Est-mCnc: 108 mg/dL
Hgb A1c MFr Bld: 5.4 % (ref 4.8–5.6)

## 2016-08-28 LAB — MICROALBUMIN / CREATININE URINE RATIO
CREATININE, UR: 108.5 mg/dL
MICROALB/CREAT RATIO: 4.7 mg/g{creat} (ref 0.0–30.0)
MICROALBUM., U, RANDOM: 5.1 ug/mL

## 2016-08-29 ENCOUNTER — Other Ambulatory Visit: Payer: Self-pay | Admitting: Family Medicine

## 2016-09-02 ENCOUNTER — Ambulatory Visit (INDEPENDENT_AMBULATORY_CARE_PROVIDER_SITE_OTHER): Payer: 59 | Admitting: Family Medicine

## 2016-09-02 ENCOUNTER — Encounter: Payer: Self-pay | Admitting: Family Medicine

## 2016-09-02 VITALS — BP 108/68 | Ht 60.0 in | Wt 169.0 lb

## 2016-09-02 DIAGNOSIS — I10 Essential (primary) hypertension: Secondary | ICD-10-CM

## 2016-09-02 DIAGNOSIS — R7303 Prediabetes: Secondary | ICD-10-CM | POA: Diagnosis not present

## 2016-09-02 DIAGNOSIS — Z6833 Body mass index (BMI) 33.0-33.9, adult: Secondary | ICD-10-CM | POA: Diagnosis not present

## 2016-09-02 DIAGNOSIS — E784 Other hyperlipidemia: Secondary | ICD-10-CM

## 2016-09-02 DIAGNOSIS — E7849 Other hyperlipidemia: Secondary | ICD-10-CM

## 2016-09-02 DIAGNOSIS — E669 Obesity, unspecified: Secondary | ICD-10-CM

## 2016-09-02 MED ORDER — METFORMIN HCL 500 MG PO TABS: ORAL_TABLET | ORAL | 1 refills | Status: DC

## 2016-09-02 MED ORDER — HYDROCHLOROTHIAZIDE 25 MG PO TABS: ORAL_TABLET | ORAL | 1 refills | Status: DC

## 2016-09-02 MED ORDER — OMEPRAZOLE 20 MG PO CPDR
20.0000 mg | DELAYED_RELEASE_CAPSULE | Freq: Two times a day (BID) | ORAL | 1 refills | Status: DC
Start: 2016-09-02 — End: 2017-03-09

## 2016-09-02 MED ORDER — CITALOPRAM HYDROBROMIDE 20 MG PO TABS: ORAL_TABLET | ORAL | 1 refills | Status: DC

## 2016-09-02 MED ORDER — LISINOPRIL 5 MG PO TABS: 5.0000 mg | ORAL_TABLET | Freq: Every day | ORAL | 1 refills | Status: DC

## 2016-09-02 MED ORDER — PRAVASTATIN SODIUM 40 MG PO TABS: ORAL_TABLET | ORAL | 1 refills | Status: DC

## 2016-09-02 NOTE — Progress Notes (Signed)
   Subjective:    Patient ID: Courtney Hunter, female    DOB: 27-Apr-1966, 50 y.o.   MRN: [id number]  Diabetes  She presents for her follow-up diabetic visit. She has type 2 diabetes mellitus. There are no hypoglycemic associated symptoms. Pertinent negatives for hypoglycemia include no confusion. There are no diabetic associated symptoms. Pertinent negatives for diabetes include no chest pain, no fatigue, no polydipsia, no polyphagia and no weakness. There are no hypoglycemic complications. There are no diabetic complications. There are no known risk factors for coronary artery disease. Current diabetic treatment includes oral agent (monotherapy). She is compliant with treatment all of the time.   Patient has not had diabetic eye exam this year yet.   Patient has no concerns at this time.  Using Isogenic shakes Has lost weight She does take omeprazole twice a day for her reflux states if she doesn't do so she has severe heartburn she is going to try using over-the-counter Zantac in place of one of the omeprazole to see if that does better She does take her cholesterol medicine regular basis previous labs review Takes her Celexa every day does help with her moods  Using one metformin per day   Review of Systems  Constitutional: Negative for activity change, appetite change and fatigue.  HENT: Negative for congestion.   Respiratory: Negative for cough.   Cardiovascular: Negative for chest pain.  Gastrointestinal: Negative for abdominal pain.  Endocrine: Negative for polydipsia and polyphagia.  Neurological: Negative for weakness.  Psychiatric/Behavioral: Negative for confusion.       Objective:   Physical Exam  Constitutional: She appears well-nourished. No distress.  Cardiovascular: Normal rate, regular rhythm and normal heart sounds.   No murmur heard. Pulmonary/Chest: Effort normal and breath sounds normal. No respiratory distress.  Musculoskeletal: She exhibits no edema.    Lymphadenopathy:    She has no cervical adenopathy.  Neurological: She is alert. She exhibits normal muscle tone.  Psychiatric: Her behavior is normal.  Vitals reviewed.         Assessment & Plan:  Hyperlipidemia continue current medication previous labs reviewed follow diet stay active  Obesity patient is losing weight continue to lose weight  Hypertension good control overall continue diuretic. Lab work looked good  Prediabetes metformin 1 a day watch diet closely medication, recheck in 6 months

## 2016-11-07 LAB — HM DIABETES EYE EXAM

## 2016-11-14 ENCOUNTER — Encounter: Payer: Self-pay | Admitting: *Deleted

## 2017-01-01 ENCOUNTER — Other Ambulatory Visit: Payer: Self-pay | Admitting: Family Medicine

## 2017-01-01 NOTE — Telephone Encounter (Signed)
May have 3 refills 

## 2017-02-19 ENCOUNTER — Other Ambulatory Visit: Payer: Self-pay | Admitting: Family Medicine

## 2017-03-02 ENCOUNTER — Telehealth: Payer: Self-pay | Admitting: Family Medicine

## 2017-03-02 ENCOUNTER — Other Ambulatory Visit: Payer: Self-pay | Admitting: Nurse Practitioner

## 2017-03-02 DIAGNOSIS — Z79899 Other long term (current) drug therapy: Secondary | ICD-10-CM

## 2017-03-02 DIAGNOSIS — E7849 Other hyperlipidemia: Secondary | ICD-10-CM

## 2017-03-02 NOTE — Telephone Encounter (Signed)
Patient notified

## 2017-03-02 NOTE — Telephone Encounter (Signed)
Labs ordered.

## 2017-03-02 NOTE — Telephone Encounter (Signed)
Pt is requesting lab orders to be sent over for an upcoming appt, pt will need to get those done this week. Last labs per epic were: microalbumin,a1c,bmp,hepatic, and lipid on 08/27/2016.

## 2017-03-03 ENCOUNTER — Ambulatory Visit: Payer: 59 | Admitting: Family Medicine

## 2017-03-05 LAB — LIPID PANEL
CHOLESTEROL TOTAL: 161 mg/dL (ref 100–199)
Chol/HDL Ratio: 3.5 ratio (ref 0.0–4.4)
HDL: 46 mg/dL (ref >39)
LDL Calculated: 75 mg/dL (ref 0–99)
TRIGLYCERIDES: 200 mg/dL — AB (ref 0–149)
VLDL Cholesterol Cal: 40 mg/dL (ref 5–40)

## 2017-03-05 LAB — HEPATIC FUNCTION PANEL
ALK PHOS: 56 IU/L (ref 39–117)
ALT: 24 IU/L (ref 0–32)
AST: 24 IU/L (ref 0–40)
Albumin: 4.3 g/dL (ref 3.5–5.5)
BILIRUBIN TOTAL: 0.3 mg/dL (ref 0.0–1.2)
BILIRUBIN, DIRECT: 0.1 mg/dL (ref 0.00–0.40)
Total Protein: 7.1 g/dL (ref 6.0–8.5)

## 2017-03-09 ENCOUNTER — Encounter: Payer: Self-pay | Admitting: Family Medicine

## 2017-03-09 ENCOUNTER — Ambulatory Visit (INDEPENDENT_AMBULATORY_CARE_PROVIDER_SITE_OTHER): Payer: 59 | Admitting: Family Medicine

## 2017-03-09 VITALS — BP 124/80 | Ht 60.0 in | Wt 179.5 lb

## 2017-03-09 DIAGNOSIS — K219 Gastro-esophageal reflux disease without esophagitis: Secondary | ICD-10-CM | POA: Diagnosis not present

## 2017-03-09 DIAGNOSIS — E7849 Other hyperlipidemia: Secondary | ICD-10-CM

## 2017-03-09 DIAGNOSIS — I10 Essential (primary) hypertension: Secondary | ICD-10-CM

## 2017-03-09 DIAGNOSIS — R7303 Prediabetes: Secondary | ICD-10-CM | POA: Diagnosis not present

## 2017-03-09 DIAGNOSIS — G4700 Insomnia, unspecified: Secondary | ICD-10-CM | POA: Diagnosis not present

## 2017-03-09 DIAGNOSIS — E784 Other hyperlipidemia: Secondary | ICD-10-CM | POA: Diagnosis not present

## 2017-03-09 LAB — POCT GLYCOSYLATED HEMOGLOBIN (HGB A1C): HEMOGLOBIN A1C: 4.9

## 2017-03-09 MED ORDER — ALPRAZOLAM 1 MG PO TABS: 1.0000 mg | ORAL_TABLET | Freq: Every evening | ORAL | 5 refills | Status: DC | PRN

## 2017-03-09 MED ORDER — METFORMIN HCL 500 MG PO TABS: ORAL_TABLET | ORAL | 1 refills | Status: DC

## 2017-03-09 MED ORDER — LISINOPRIL 5 MG PO TABS: ORAL_TABLET | ORAL | 1 refills | Status: DC

## 2017-03-09 MED ORDER — OMEPRAZOLE 20 MG PO CPDR: 20.0000 mg | DELAYED_RELEASE_CAPSULE | Freq: Every day | ORAL | 1 refills | Status: DC

## 2017-03-09 MED ORDER — PRAVASTATIN SODIUM 40 MG PO TABS: ORAL_TABLET | ORAL | 1 refills | Status: DC

## 2017-03-09 MED ORDER — HYDROCHLOROTHIAZIDE 25 MG PO TABS: ORAL_TABLET | ORAL | 1 refills | Status: DC

## 2017-03-09 MED ORDER — CITALOPRAM HYDROBROMIDE 20 MG PO TABS: ORAL_TABLET | ORAL | 1 refills | Status: DC

## 2017-03-09 NOTE — Progress Notes (Signed)
   Subjective:    Patient ID: Courtney Hunter, female    DOB: [birth year], 51 y.o.   MRN: [id number]  Hypertension  This is a chronic problem. The current episode started more than 1 year ago. Pertinent negatives include no chest pain. There are no compliance problems.    States no other concerns this visit.  Patient relates she uses Xanax to help her sleep at night denies problems with it Tolerates her 81 mg aspirin Depression medicine helps her with anxiety related issues she states she's doing well Blood pressure overall doing well on medications Takes her diabetes medicine she's been doing well with this Her diet been doing fair Exercise subpar Weight gain moderate Reflux symptoms under good control-uses one per day and takes the ranitidine in the evening. Takes her cholesterol medicine tolerates it well  Results for orders placed or performed in visit on 03/09/17  POCT HgB A1C  Result Value Ref Range   Hemoglobin A1C 4.9     Review of Systems  Constitutional: Negative for activity change, appetite change and fatigue.  HENT: Negative for congestion.   Respiratory: Negative for cough.   Cardiovascular: Negative for chest pain.  Gastrointestinal: Negative for abdominal pain.  Endocrine: Negative for polydipsia and polyphagia.  Neurological: Negative for weakness.  Psychiatric/Behavioral: Negative for confusion.       Objective:   Physical Exam  Constitutional: She appears well-nourished. No distress.  Cardiovascular: Normal rate, regular rhythm and normal heart sounds.   No murmur heard. Pulmonary/Chest: Effort normal and breath sounds normal. No respiratory distress.  Musculoskeletal: She exhibits no edema.  Lymphadenopathy:    She has no cervical adenopathy.  Neurological: She is alert. She exhibits normal muscle tone.  Psychiatric: Her behavior is normal.  Vitals reviewed.         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

## 2017-04-21 ENCOUNTER — Encounter: Payer: Self-pay | Admitting: Family Medicine

## 2017-04-22 ENCOUNTER — Other Ambulatory Visit: Payer: Self-pay | Admitting: *Deleted

## 2017-04-22 MED ORDER — SERTRALINE HCL 100 MG PO TABS: 100.0000 mg | ORAL_TABLET | Freq: Every day | ORAL | 4 refills | Status: DC

## 2017-04-22 NOTE — Telephone Encounter (Signed)
Based on notes from the patient I recommend the following stopped Celexa. Start Zoloft 100 mg, #30, 1 daily, 4 refills. Please have patient follow-up within 6 weeks to check to see how well things are going

## 2017-05-20 ENCOUNTER — Other Ambulatory Visit: Payer: Self-pay | Admitting: *Deleted

## 2017-05-20 MED ORDER — HYDROCHLOROTHIAZIDE 25 MG PO TABS: ORAL_TABLET | ORAL | 0 refills | Status: DC

## 2017-08-14 ENCOUNTER — Other Ambulatory Visit: Payer: Self-pay | Admitting: Family Medicine

## 2017-08-18 ENCOUNTER — Other Ambulatory Visit: Payer: Self-pay | Admitting: Family Medicine

## 2017-09-02 ENCOUNTER — Other Ambulatory Visit: Payer: Self-pay | Admitting: Family Medicine

## 2017-09-02 ENCOUNTER — Telehealth: Payer: Self-pay | Admitting: Family Medicine

## 2017-09-02 DIAGNOSIS — E785 Hyperlipidemia, unspecified: Secondary | ICD-10-CM

## 2017-09-02 DIAGNOSIS — R7303 Prediabetes: Secondary | ICD-10-CM

## 2017-09-02 DIAGNOSIS — Z79899 Other long term (current) drug therapy: Secondary | ICD-10-CM

## 2017-09-02 NOTE — Telephone Encounter (Signed)
Lipid, liver, metabolic 7, hemoglobin A1c, urine ACR 

## 2017-09-02 NOTE — Telephone Encounter (Signed)
Pt last had drawn 08/27/2016 lipid,hepatic fx panel,Bmet,A1C, micro albumin urine. Please advise.Thanks,CS

## 2017-09-02 NOTE — Telephone Encounter (Signed)
Patient has an appointment on 09/08/17 with Dr. Lorin PicketScott.  She is requesting order for labs.

## 2017-09-03 NOTE — Telephone Encounter (Signed)
I called left a detailed message on vm,that she may have labs drawn at [facility] order sent. If any questions call our office.

## 2017-09-08 ENCOUNTER — Ambulatory Visit: Payer: 59 | Admitting: Family Medicine

## 2017-09-15 ENCOUNTER — Ambulatory Visit: Payer: 59 | Admitting: Family Medicine

## 2017-09-23 ENCOUNTER — Other Ambulatory Visit: Payer: Self-pay | Admitting: Family Medicine

## 2017-09-23 NOTE — Telephone Encounter (Signed)
Six mo visit overdue, 30 d only

## 2017-09-25 ENCOUNTER — Ambulatory Visit: Payer: 59 | Admitting: Family Medicine

## 2017-09-25 ENCOUNTER — Encounter: Payer: Self-pay | Admitting: Family Medicine

## 2017-09-25 VITALS — BP 122/82 | Ht 60.0 in | Wt 188.0 lb

## 2017-09-25 DIAGNOSIS — R7303 Prediabetes: Secondary | ICD-10-CM

## 2017-09-25 DIAGNOSIS — I10 Essential (primary) hypertension: Secondary | ICD-10-CM

## 2017-09-25 DIAGNOSIS — E785 Hyperlipidemia, unspecified: Secondary | ICD-10-CM | POA: Diagnosis not present

## 2017-09-25 MED ORDER — LISINOPRIL 5 MG PO TABS: ORAL_TABLET | ORAL | 1 refills | Status: DC

## 2017-09-25 MED ORDER — ALPRAZOLAM 1 MG PO TABS: 1.0000 mg | ORAL_TABLET | Freq: Every evening | ORAL | 5 refills | Status: DC | PRN

## 2017-09-25 MED ORDER — OMEPRAZOLE 20 MG PO CPDR: DELAYED_RELEASE_CAPSULE | ORAL | 0 refills | Status: DC

## 2017-09-25 MED ORDER — METFORMIN HCL 500 MG PO TABS: ORAL_TABLET | ORAL | 1 refills | Status: DC

## 2017-09-25 MED ORDER — HYDROCHLOROTHIAZIDE 25 MG PO TABS: ORAL_TABLET | ORAL | 1 refills | Status: DC

## 2017-09-25 MED ORDER — SERTRALINE HCL 100 MG PO TABS: 150.0000 mg | ORAL_TABLET | Freq: Every day | ORAL | 1 refills | Status: DC

## 2017-09-25 MED ORDER — PRAVASTATIN SODIUM 40 MG PO TABS: 40.0000 mg | ORAL_TABLET | Freq: Every day | ORAL | 1 refills | Status: DC

## 2017-09-25 NOTE — Progress Notes (Signed)
Subjective:    Patient ID: Courtney HsuHarriet S Bounds, female    DOB: 15-Dec-1965, 51 y.o.   MRN: [id number]  Hypertension  This is a chronic problem. The current episode started more than 1 year ago. Pertinent negatives include no chest pain. Risk factors for coronary artery disease include dyslipidemia and post-menopausal state. Treatments tried: hctz, lisinopril. There are no compliance problems.    Patient to have her blood work drawn next week-already ordered The patient is trying to watch how she eats a separate from the past few weeks because of the holidays is going to get more active coming up denies chest tightness pressure pain  Review of Systems  Constitutional: Negative for activity change, appetite change and fatigue.  HENT: Negative for congestion.   Respiratory: Negative for cough.   Cardiovascular: Negative for chest pain.  Gastrointestinal: Negative for abdominal pain.  Endocrine: Negative for polydipsia and polyphagia.  Skin: Negative for color change.  Neurological: Negative for weakness.  Psychiatric/Behavioral: Negative for confusion.       Objective:   Physical Exam  Constitutional: She appears well-developed and well-nourished. No distress.  HENT:  Head: Normocephalic and atraumatic.  Eyes: Right eye exhibits no discharge. Left eye exhibits no discharge.  Neck: No tracheal deviation present.  Cardiovascular: Normal rate, regular rhythm and normal heart sounds.  No murmur heard. Pulmonary/Chest: Effort normal and breath sounds normal. No respiratory distress. She has no wheezes. She has no rales.  Musculoskeletal: She exhibits no edema.  Lymphadenopathy:    She has no cervical adenopathy.  Neurological: She is alert. She exhibits normal muscle tone.  Skin: Skin is warm and dry. No erythema.  Psychiatric: Her behavior is normal.  Vitals reviewed.         Assessment & Plan:  The patient was seen today as part of an evaluation regarding hyperlipidemia.  Recent lab work has been reviewed with the patient as well as the goals for good cholesterol care. In addition to this medications have been discussed the importance of compliance with diet and medications discussed as well. Patient has been informed of potential side effects of medications in the importance to notify us should any problems occur. Finally the patient is aware that poor control of cholesterol, noncompliance can dramatically increase her risk of heart attack strokes and premature death. The patient will keep regular office visits and the patient does agreed to periodic lab work.  HTN- Patient was seen today as part of a visit regarding hypertension. The importance of healthy diet and regular physical activity was discussed. The importance of compliance with medications discussed. Ideal goal is to keep blood pressure low elevated levels certainly below 140/90 when possible. The patient was counseled that keeping blood pressure under control lessen his risk of heart attack, stroke, kidney failure, and early death. The importance of regular follow-ups was discussed with the patient. Low-salt diet such as DASH recommended. Regular physical activity was recommended as well. Patient was advised to keep regular follow-ups.  The patient was seen today as part of an evaluation regarding hyperlipidemia. Recent lab work has been reviewed with the patient as well as the goals for good cholesterol care. In addition to this medications have been discussed the importance of compliance with diet and medications discussed as well. Patient has been informed of potential side effects of medications in the importance to notify us should any problems occur. Finally the patient is aware that poor control of cholesterol, noncompliance can dramatically increase her risk of heart  attack strokes and premature death. The patient will keep regular office visits and the patient does agreed to periodic lab work.  Overall  patient trying hard with watching diet staying active will continue her medications.  The patient was seen today in followup for depression. Discussion was held regarding the importance of taking the medication. Discussion was also held regarding the importance of notifying us if becoming more depressed and certainly emergently notifying us or going to the emergency department if becoming suicidal.  Importance of social engagement was also discussed along with behavioral techniques to lessen depression. Regular followup visits were discussed as well. Will go ahead and increase the dose of sertraline 1-1/2 tablet daily patient will let us know if that is helping she denies being suicidal

## 2017-11-16 ENCOUNTER — Other Ambulatory Visit: Payer: Self-pay | Admitting: Family Medicine

## 2017-11-26 ENCOUNTER — Telehealth: Payer: 59 | Admitting: Family

## 2017-11-26 DIAGNOSIS — B9689 Other specified bacterial agents as the cause of diseases classified elsewhere: Secondary | ICD-10-CM

## 2017-11-26 DIAGNOSIS — J028 Acute pharyngitis due to other specified organisms: Secondary | ICD-10-CM

## 2017-11-26 DIAGNOSIS — J029 Acute pharyngitis, unspecified: Secondary | ICD-10-CM

## 2017-11-26 MED ORDER — PREDNISONE 5 MG PO TABS: 5.0000 mg | ORAL_TABLET | ORAL | 0 refills | Status: DC

## 2017-11-26 MED ORDER — FLUTICASONE PROPIONATE 50 MCG/ACT NA SUSP: 2.0000 | Freq: Every day | NASAL | 1 refills | Status: DC

## 2017-11-26 NOTE — Progress Notes (Signed)
Thank you for the details you included in the comment boxes. Those details are very helpful in determining the best course of treatment for you and help us to provide the best care.  We are sorry that you are not feeling well.  Here is how we plan to help!  Based on what you have shared with me it looks like you have sinusitis.  Sinusitis is inflammation and infection in the sinus cavities of the head.  Based on your presentation I believe you most likely have Acute Viral Sinusitis.This is an infection most likely caused by a virus. There is not specific treatment for viral sinusitis other than to help you with the symptoms until the infection runs its course.  You may use an oral decongestant such as Mucinex D or if you have glaucoma or high blood pressure use plain Mucinex. Saline nasal spray help and can safely be used as often as needed for congestion, I have prescribed: Fluticasone nasal spray two sprays in each nostril once a day   I have also prescribed a prednisone (low-dose) pack to help with the pain/pressure in your teeth and head.   Some authorities believe that zinc sprays or the use of Echinacea may shorten the course of your symptoms.  Sinus infections are not as easily transmitted as other respiratory infection, however we still recommend that you avoid close contact with loved ones, especially the very young and elderly.  Remember to wash your hands thoroughly throughout the day as this is the number one way to prevent the spread of infection!  Home Care:  Only take medications as instructed by your medical team.  Do not take these medications with alcohol.  A steam or ultrasonic humidifier can help congestion.  You can place a towel over your head and breathe in the steam from hot water coming from a faucet.  Avoid close contacts especially the very young and the elderly.  Cover your mouth when you cough or sneeze.  Always remember to wash your hands.  Get Help Right Away  If:  You develop worsening fever or sinus pain.  You develop a severe head ache or visual changes.  Your symptoms persist after you have completed your treatment plan.  Make sure you  Understand these instructions.  Will watch your condition.  Will get help right away if you are not doing well or get worse.  Your e-visit answers were reviewed by a board certified advanced clinical practitioner to complete your personal care plan.  Depending on the condition, your plan could have included both over the counter or prescription medications.  If there is a problem please reply  once you have received a response from your provider.  Your safety is important to us.  If you have drug allergies check your prescription carefully.    You can use MyChart to ask questions about today's visit, request a non-urgent call back, or ask for a work or school excuse for 24 hours related to this e-Visit. If it has been greater than 24 hours you will need to follow up with your provider, or enter a new e-Visit to address those concerns.  You will get an e-mail in the next two days asking about your experience.  I hope that your e-visit has been valuable and will speed your recovery. Thank you for using e-visits.

## 2018-02-26 ENCOUNTER — Telehealth: Payer: Self-pay | Admitting: Family Medicine

## 2018-02-26 DIAGNOSIS — R7303 Prediabetes: Secondary | ICD-10-CM

## 2018-02-26 DIAGNOSIS — I10 Essential (primary) hypertension: Secondary | ICD-10-CM

## 2018-02-26 DIAGNOSIS — E7800 Pure hypercholesterolemia, unspecified: Secondary | ICD-10-CM

## 2018-02-26 DIAGNOSIS — Z1159 Encounter for screening for other viral diseases: Secondary | ICD-10-CM

## 2018-02-26 DIAGNOSIS — Z114 Encounter for screening for human immunodeficiency virus [HIV]: Secondary | ICD-10-CM

## 2018-02-26 DIAGNOSIS — Z79899 Other long term (current) drug therapy: Secondary | ICD-10-CM

## 2018-02-26 NOTE — Telephone Encounter (Signed)
Patient has an appointment on 03/24/18 with Dr. Lorin Picket.  She is requesting a new order for labs.

## 2018-02-26 NOTE — Telephone Encounter (Signed)
Patient last labs 08/2017-Lipid, Liver, Met 7, HgbA1c, Microalbumin urine.

## 2018-02-28 NOTE — Telephone Encounter (Signed)
  Lipid, liver, metabolic 7, A1c.  Also please discuss with the patient that Centers for Disease Control recommends one-time testing for hepatitis C antibody and HIV antibody.  We agree with this recommendation.  If the patient is okay with this order these tests as well.

## 2018-03-01 NOTE — Telephone Encounter (Signed)
Patient is aware and orders placed.

## 2018-03-01 NOTE — Addendum Note (Signed)
Addended by: Meredith LeedsSUTTON, Josefa Syracuse L on: 03/01/2018 08:08 AM   Modules accepted: Orders

## 2018-03-20 LAB — HEPATIC FUNCTION PANEL
ALT: 25 IU/L (ref 0–32)
AST: 22 IU/L (ref 0–40)
Albumin: 4.4 g/dL (ref 3.5–5.5)
Alkaline Phosphatase: 53 IU/L (ref 39–117)
BILIRUBIN TOTAL: 0.2 mg/dL (ref 0.0–1.2)
Bilirubin, Direct: 0.08 mg/dL (ref 0.00–0.40)
Total Protein: 7.4 g/dL (ref 6.0–8.5)

## 2018-03-20 LAB — HEPATITIS C ANTIBODY

## 2018-03-20 LAB — LIPID PANEL
CHOLESTEROL TOTAL: 170 mg/dL (ref 100–199)
Chol/HDL Ratio: 3 ratio (ref 0.0–4.4)
HDL: 56 mg/dL (ref >39)
LDL CALC: 82 mg/dL (ref 0–99)
TRIGLYCERIDES: 161 mg/dL — AB (ref 0–149)
VLDL Cholesterol Cal: 32 mg/dL (ref 5–40)

## 2018-03-20 LAB — BASIC METABOLIC PANEL
BUN / CREAT RATIO: 17 (ref 9–23)
BUN: 16 mg/dL (ref 6–24)
CALCIUM: 9.8 mg/dL (ref 8.7–10.2)
CHLORIDE: 100 mmol/L (ref 96–106)
CO2: 27 mmol/L (ref 20–29)
Creatinine, Ser: 0.94 mg/dL (ref 0.57–1.00)
GFR calc non Af Amer: 69 mL/min/{1.73_m2} (ref >59)
GFR, EST AFRICAN AMERICAN: 80 mL/min/{1.73_m2} (ref >59)
Glucose: 109 mg/dL — ABNORMAL HIGH (ref 65–99)
POTASSIUM: 3.9 mmol/L (ref 3.5–5.2)
SODIUM: 139 mmol/L (ref 134–144)

## 2018-03-20 LAB — HIV ANTIBODY (ROUTINE TESTING W REFLEX): HIV Screen 4th Generation wRfx: NONREACTIVE

## 2018-03-20 LAB — HEMOGLOBIN A1C
Est. average glucose Bld gHb Est-mCnc: 117 mg/dL
Hgb A1c MFr Bld: 5.7 % — ABNORMAL HIGH (ref 4.8–5.6)

## 2018-03-24 ENCOUNTER — Encounter: Payer: Self-pay | Admitting: Family Medicine

## 2018-03-24 ENCOUNTER — Ambulatory Visit: Payer: 59 | Admitting: Family Medicine

## 2018-03-24 VITALS — BP 154/100 | Ht 60.0 in | Wt 189.0 lb

## 2018-03-24 DIAGNOSIS — R7303 Prediabetes: Secondary | ICD-10-CM

## 2018-03-24 DIAGNOSIS — I10 Essential (primary) hypertension: Secondary | ICD-10-CM | POA: Diagnosis not present

## 2018-03-24 DIAGNOSIS — E7849 Other hyperlipidemia: Secondary | ICD-10-CM

## 2018-03-24 DIAGNOSIS — J301 Allergic rhinitis due to pollen: Secondary | ICD-10-CM | POA: Diagnosis not present

## 2018-03-24 MED ORDER — FLUTICASONE PROPIONATE 50 MCG/ACT NA SUSP: 2.0000 | Freq: Every day | NASAL | 1 refills | Status: DC

## 2018-03-24 MED ORDER — METFORMIN HCL 500 MG PO TABS: ORAL_TABLET | ORAL | 5 refills | Status: DC

## 2018-03-24 MED ORDER — HYDROCHLOROTHIAZIDE 25 MG PO TABS: ORAL_TABLET | ORAL | 1 refills | Status: DC

## 2018-03-24 MED ORDER — ALBUTEROL SULFATE HFA 108 (90 BASE) MCG/ACT IN AERS
INHALATION_SPRAY | RESPIRATORY_TRACT | 5 refills | Status: DC
Start: 2018-03-24 — End: 2018-03-25

## 2018-03-24 MED ORDER — SERTRALINE HCL 100 MG PO TABS: 150.0000 mg | ORAL_TABLET | Freq: Every day | ORAL | 1 refills | Status: DC

## 2018-03-24 MED ORDER — LISINOPRIL 5 MG PO TABS: ORAL_TABLET | ORAL | 1 refills | Status: DC

## 2018-03-24 MED ORDER — PRAVASTATIN SODIUM 40 MG PO TABS: 40.0000 mg | ORAL_TABLET | Freq: Every day | ORAL | 1 refills | Status: DC

## 2018-03-24 NOTE — Progress Notes (Signed)
   Subjective:    Patient ID: Courtney Hunter, female    DOB: 1966-06-07, 52 y.o.   MRN: [id number]  HPI  Very nice patient comes in today for follow-up of chronic illnesses Patient is here today to follow up on chronic illnesses.She eats healthy and exercises occasionally.She does not see any specialists.She had her labs drawn 03/19/2018 and last A1c was 5.7 at that time.   Pre- Right foot pain- top of foot Several weeks Mainly pain with walking Pain when she gets up  Review of Systems  Constitutional: Negative for activity change, appetite change and fatigue.  HENT: Negative for congestion and rhinorrhea.   Respiratory: Negative for cough and shortness of breath.   Cardiovascular: Negative for chest pain and leg swelling.  Gastrointestinal: Negative for abdominal pain and diarrhea.  Endocrine: Negative for polydipsia and polyphagia.  Skin: Negative for color change.  Neurological: Negative for weakness.  Psychiatric/Behavioral: Negative for confusion.       Objective:   Physical Exam  Constitutional: She appears well-nourished. No distress.  HENT:  Head: Normocephalic and atraumatic.  Eyes: Right eye exhibits no discharge. Left eye exhibits no discharge.  Neck: No tracheal deviation present.  Cardiovascular: Normal rate, regular rhythm and normal heart sounds.  No murmur heard. Pulmonary/Chest: Effort normal and breath sounds normal. No respiratory distress.  Abdominal: Soft. There is no tenderness. There is no guarding.  Musculoskeletal: She exhibits no edema.  Lymphadenopathy:    She has no cervical adenopathy.  Neurological: She is alert. She exhibits normal muscle tone.  Skin: Skin is warm and dry.  Psychiatric: Her behavior is normal.  Vitals reviewed.         Assessment & Plan:  Patient here for follow-up regarding cholesterol.  Patient does try to maintain a reasonable diet.  Patient does take the medication on a regular basis.  Denies missing a dose.   The patient denies any obvious side effects.  Prior blood work results reviewed with the patient.  The patient is aware of his cholesterol goals and the need to keep it under good control to lessen the risk of disease.  The patient was seen today as part of a comprehensive pre-diabetic check up.The patient relates medication compliance. No significant side effects to the medications. Denies any low glucose spells. Relates compliance with diet to a reasonable level. Patient does do labwork intermittently and understands the dangers of diabetes.  Patient for blood pressure check up. Patient relates compliance with meds. Todays BP reviewed with the patient. Patient denies issues with medication. Patient relates reasonable diet. Patient tries to minimize salt. Patient aware of BP goals.

## 2018-03-25 ENCOUNTER — Other Ambulatory Visit: Payer: Self-pay | Admitting: *Deleted

## 2018-03-25 ENCOUNTER — Telehealth: Payer: Self-pay | Admitting: *Deleted

## 2018-03-25 MED ORDER — ALBUTEROL SULFATE HFA 108 (90 BASE) MCG/ACT IN AERS: INHALATION_SPRAY | RESPIRATORY_TRACT | 5 refills | Status: DC

## 2018-03-25 NOTE — Telephone Encounter (Signed)
Fax from [facility]. Albuterol hfa inhaler denied. Called pharm and had them to run it as ventolin and it went through.

## 2018-03-26 ENCOUNTER — Other Ambulatory Visit: Payer: Self-pay | Admitting: Family Medicine

## 2018-04-13 ENCOUNTER — Other Ambulatory Visit: Payer: Self-pay | Admitting: Family Medicine

## 2018-05-27 ENCOUNTER — Encounter: Payer: Self-pay | Admitting: Family Medicine

## 2018-08-27 ENCOUNTER — Telehealth: Payer: Self-pay | Admitting: Family Medicine

## 2018-08-27 DIAGNOSIS — Z79899 Other long term (current) drug therapy: Secondary | ICD-10-CM

## 2018-08-27 DIAGNOSIS — R7303 Prediabetes: Secondary | ICD-10-CM

## 2018-08-27 DIAGNOSIS — I10 Essential (primary) hypertension: Secondary | ICD-10-CM

## 2018-08-27 DIAGNOSIS — E7849 Other hyperlipidemia: Secondary | ICD-10-CM

## 2018-08-27 NOTE — Telephone Encounter (Signed)
Last labs on 03/19/18. A1c, bmp, lipid, liver, hep c, hiv,

## 2018-08-27 NOTE — Telephone Encounter (Signed)
Patient has 6 month follow up on 12/16 and needing labs done.

## 2018-08-29 NOTE — Telephone Encounter (Signed)
Lipid, liver, metabolic 7, A1c, urine ACR °

## 2018-08-30 NOTE — Telephone Encounter (Signed)
Patient is aware.Order sent. 

## 2018-09-08 ENCOUNTER — Other Ambulatory Visit: Payer: Self-pay | Admitting: Family Medicine

## 2018-09-10 LAB — LIPID PANEL
CHOLESTEROL TOTAL: 175 mg/dL (ref 100–199)
Chol/HDL Ratio: 3.4 ratio (ref 0.0–4.4)
HDL: 51 mg/dL (ref >39)
LDL Calculated: 90 mg/dL (ref 0–99)
Triglycerides: 169 mg/dL — ABNORMAL HIGH (ref 0–149)
VLDL Cholesterol Cal: 34 mg/dL (ref 5–40)

## 2018-09-10 LAB — BASIC METABOLIC PANEL
BUN / CREAT RATIO: 15 (ref 9–23)
BUN: 13 mg/dL (ref 6–24)
CO2: 25 mmol/L (ref 20–29)
CREATININE: 0.87 mg/dL (ref 0.57–1.00)
Calcium: 10.1 mg/dL (ref 8.7–10.2)
Chloride: 98 mmol/L (ref 96–106)
GFR calc Af Amer: 88 mL/min/{1.73_m2} (ref >59)
GFR, EST NON AFRICAN AMERICAN: 76 mL/min/{1.73_m2} (ref >59)
Glucose: 123 mg/dL — ABNORMAL HIGH (ref 65–99)
POTASSIUM: 3.6 mmol/L (ref 3.5–5.2)
SODIUM: 141 mmol/L (ref 134–144)

## 2018-09-10 LAB — HEPATIC FUNCTION PANEL
ALBUMIN: 4.6 g/dL (ref 3.5–5.5)
ALT: 30 IU/L (ref 0–32)
AST: 31 IU/L (ref 0–40)
Alkaline Phosphatase: 54 IU/L (ref 39–117)
BILIRUBIN, DIRECT: 0.14 mg/dL (ref 0.00–0.40)
Bilirubin Total: 0.3 mg/dL (ref 0.0–1.2)
Total Protein: 7.7 g/dL (ref 6.0–8.5)

## 2018-09-10 LAB — MICROALBUMIN / CREATININE URINE RATIO
Creatinine, Urine: 101 mg/dL
Microalb/Creat Ratio: 3.6 mg/g creat (ref 0.0–30.0)
Microalbumin, Urine: 3.6 ug/mL

## 2018-09-10 LAB — HEMOGLOBIN A1C
ESTIMATED AVERAGE GLUCOSE: 123 mg/dL
Hgb A1c MFr Bld: 5.9 % — ABNORMAL HIGH (ref 4.8–5.6)

## 2018-09-13 ENCOUNTER — Encounter: Payer: Self-pay | Admitting: Family Medicine

## 2018-09-13 ENCOUNTER — Ambulatory Visit: Payer: 59 | Admitting: Family Medicine

## 2018-09-13 VITALS — BP 122/82 | Ht 60.0 in | Wt 191.0 lb

## 2018-09-13 DIAGNOSIS — I10 Essential (primary) hypertension: Secondary | ICD-10-CM | POA: Diagnosis not present

## 2018-09-13 DIAGNOSIS — E7849 Other hyperlipidemia: Secondary | ICD-10-CM | POA: Diagnosis not present

## 2018-09-13 DIAGNOSIS — R7303 Prediabetes: Secondary | ICD-10-CM | POA: Diagnosis not present

## 2018-09-13 DIAGNOSIS — G4700 Insomnia, unspecified: Secondary | ICD-10-CM

## 2018-09-13 DIAGNOSIS — F439 Reaction to severe stress, unspecified: Secondary | ICD-10-CM

## 2018-09-13 MED ORDER — SERTRALINE HCL 100 MG PO TABS: ORAL_TABLET | ORAL | 1 refills | Status: DC

## 2018-09-13 MED ORDER — OMEPRAZOLE 20 MG PO CPDR: DELAYED_RELEASE_CAPSULE | ORAL | 1 refills | Status: DC

## 2018-09-13 MED ORDER — METFORMIN HCL 500 MG PO TABS: ORAL_TABLET | ORAL | 1 refills | Status: DC

## 2018-09-13 MED ORDER — HYDROCHLOROTHIAZIDE 25 MG PO TABS: ORAL_TABLET | ORAL | 1 refills | Status: DC

## 2018-09-13 MED ORDER — LISINOPRIL 10 MG PO TABS: ORAL_TABLET | ORAL | 1 refills | Status: DC

## 2018-09-13 MED ORDER — ALPRAZOLAM 1 MG PO TABS: 1.0000 mg | ORAL_TABLET | Freq: Every evening | ORAL | 5 refills | Status: DC | PRN

## 2018-09-13 MED ORDER — PRAVASTATIN SODIUM 40 MG PO TABS: 40.0000 mg | ORAL_TABLET | Freq: Every day | ORAL | 1 refills | Status: DC

## 2018-09-13 NOTE — Patient Instructions (Signed)
DASH Eating Plan DASH stands for "Dietary Approaches to Stop Hypertension." The DASH eating plan is a healthy eating plan that has been shown to reduce high blood pressure (hypertension). It may also reduce your risk for type 2 diabetes, heart disease, and stroke. The DASH eating plan may also help with weight loss. What are tips for following this plan? General guidelines  Avoid eating more than 2,300 mg (milligrams) of salt (sodium) a day. If you have hypertension, you may need to reduce your sodium intake to 1,500 mg a day.  Limit alcohol intake to no more than 1 drink a day for nonpregnant women and 2 drinks a day for men. One drink equals 12 oz of beer, 5 oz of wine, or 1 oz of hard liquor.  Work with your health care provider to maintain a healthy body weight or to lose weight. Ask what an ideal weight is for you.  Get at least 30 minutes of exercise that causes your heart to beat faster (aerobic exercise) most days of the week. Activities may include walking, swimming, or biking.  Work with your health care provider or diet and nutrition specialist (dietitian) to adjust your eating plan to your individual calorie needs. Reading food labels  Check food labels for the amount of sodium per serving. Choose foods with less than 5 percent of the Daily Value of sodium. Generally, foods with less than 300 mg of sodium per serving fit into this eating plan.  To find whole grains, look for the word "whole" as the first word in the ingredient list. Shopping  Buy products labeled as "low-sodium" or "no salt added."  Buy fresh foods. Avoid canned foods and premade or frozen meals. Cooking  Avoid adding salt when cooking. Use salt-free seasonings or herbs instead of table salt or sea salt. Check with your health care provider or pharmacist before using salt substitutes.  Do not fry foods. Cook foods using healthy methods such as baking, boiling, grilling, and broiling instead.  Cook with  heart-healthy oils, such as olive, canola, soybean, or sunflower oil. Meal planning   Eat a balanced diet that includes: ? 5 or more servings of fruits and vegetables each day. At each meal, try to fill half of your plate with fruits and vegetables. ? Up to 6-8 servings of whole grains each day. ? Less than 6 oz of lean meat, poultry, or fish each day. A 3-oz serving of meat is about the same size as a deck of cards. One egg equals 1 oz. ? 2 servings of low-fat dairy each day. ? A serving of nuts, seeds, or beans 5 times each week. ? Heart-healthy fats. Healthy fats called Omega-3 fatty acids are found in foods such as flaxseeds and coldwater fish, like sardines, salmon, and mackerel.  Limit how much you eat of the following: ? Canned or prepackaged foods. ? Food that is high in trans fat, such as fried foods. ? Food that is high in saturated fat, such as fatty meat. ? Sweets, desserts, sugary drinks, and other foods with added sugar. ? Full-fat dairy products.  Do not salt foods before eating.  Try to eat at least 2 vegetarian meals each week.  Eat more home-cooked food and less restaurant, buffet, and fast food.  When eating at a restaurant, ask that your food be prepared with less salt or no salt, if possible. What foods are recommended? The items listed may not be a complete list. Talk with your dietitian about what   dietary choices are best for you. Grains Whole-grain or whole-wheat bread. Whole-grain or whole-wheat pasta. Brown rice. Oatmeal. Quinoa. Bulgur. Whole-grain and low-sodium cereals. Pita bread. Low-fat, low-sodium crackers. Whole-wheat flour tortillas. Vegetables Fresh or frozen vegetables (raw, steamed, roasted, or grilled). Low-sodium or reduced-sodium tomato and vegetable juice. Low-sodium or reduced-sodium tomato sauce and tomato paste. Low-sodium or reduced-sodium canned vegetables. Fruits All fresh, dried, or frozen fruit. Canned fruit in natural juice (without  added sugar). Meat and other protein foods Skinless chicken or turkey. Ground chicken or turkey. Pork with fat trimmed off. Fish and seafood. Egg whites. Dried beans, peas, or lentils. Unsalted nuts, nut butters, and seeds. Unsalted canned beans. Lean cuts of beef with fat trimmed off. Low-sodium, lean deli meat. Dairy Low-fat (1%) or fat-free (skim) milk. Fat-free, low-fat, or reduced-fat cheeses. Nonfat, low-sodium ricotta or cottage cheese. Low-fat or nonfat yogurt. Low-fat, low-sodium cheese. Fats and oils Soft margarine without trans fats. Vegetable oil. Low-fat, reduced-fat, or light mayonnaise and salad dressings (reduced-sodium). Canola, safflower, olive, soybean, and sunflower oils. Avocado. Seasoning and other foods Herbs. Spices. Seasoning mixes without salt. Unsalted popcorn and pretzels. Fat-free sweets. What foods are not recommended? The items listed may not be a complete list. Talk with your dietitian about what dietary choices are best for you. Grains Baked goods made with fat, such as croissants, muffins, or some breads. Dry pasta or rice meal packs. Vegetables Creamed or fried vegetables. Vegetables in a cheese sauce. Regular canned vegetables (not low-sodium or reduced-sodium). Regular canned tomato sauce and paste (not low-sodium or reduced-sodium). Regular tomato and vegetable juice (not low-sodium or reduced-sodium). Pickles. Olives. Fruits Canned fruit in a light or heavy syrup. Fried fruit. Fruit in cream or butter sauce. Meat and other protein foods Fatty cuts of meat. Ribs. Fried meat. Bacon. Sausage. Bologna and other processed lunch meats. Salami. Fatback. Hotdogs. Bratwurst. Salted nuts and seeds. Canned beans with added salt. Canned or smoked fish. Whole eggs or egg yolks. Chicken or turkey with skin. Dairy Whole or 2% milk, cream, and half-and-half. Whole or full-fat cream cheese. Whole-fat or sweetened yogurt. Full-fat cheese. Nondairy creamers. Whipped toppings.  Processed cheese and cheese spreads. Fats and oils Butter. Stick margarine. Lard. Shortening. Ghee. Bacon fat. Tropical oils, such as coconut, palm kernel, or palm oil. Seasoning and other foods Salted popcorn and pretzels. Onion salt, garlic salt, seasoned salt, table salt, and sea salt. Worcestershire sauce. Tartar sauce. Barbecue sauce. Teriyaki sauce. Soy sauce, including reduced-sodium. Steak sauce. Canned and packaged gravies. Fish sauce. Oyster sauce. Cocktail sauce. Horseradish that you find on the shelf. Ketchup. Mustard. Meat flavorings and tenderizers. Bouillon cubes. Hot sauce and Tabasco sauce. Premade or packaged marinades. Premade or packaged taco seasonings. Relishes. Regular salad dressings. Where to find more information:  National Heart, Lung, and Blood Institute: www.nhlbi.nih.gov  American Heart Association: www.heart.org Summary  The DASH eating plan is a healthy eating plan that has been shown to reduce high blood pressure (hypertension). It may also reduce your risk for type 2 diabetes, heart disease, and stroke.  With the DASH eating plan, you should limit salt (sodium) intake to 2,300 mg a day. If you have hypertension, you may need to reduce your sodium intake to 1,500 mg a day.  When on the DASH eating plan, aim to eat more fresh fruits and vegetables, whole grains, lean proteins, low-fat dairy, and heart-healthy fats.  Work with your health care provider or diet and nutrition specialist (dietitian) to adjust your eating plan to your individual   calorie needs. This information is not intended to replace advice given to you by your health care provider. Make sure you discuss any questions you have with your health care provider. Document Released: 09/04/2011 Document Revised: 09/08/2016 Document Reviewed: 09/08/2016 [employer] Interactive Patient Education  2018 [employer]  

## 2018-09-13 NOTE — Progress Notes (Signed)
Subjective:    Patient ID: Courtney Hunter, female    DOB: 1965-11-27, 52 y.o.   MRN: [id number]  Hyperlipidemia  This is a chronic problem. The current episode started more than 1 year ago. Pertinent negatives include no chest pain or shortness of breath. Treatments tried: pravastatin. The current treatment provides no improvement of lipids. There are no compliance problems.  Risk factors for coronary artery disease include dyslipidemia.  Prediabetes A1c is gone up a little bit but not a lot watch diet take medicine  Blood pressure fair control increase lisinopril 10 mg daily follow-up within 6 months  Morbid obesity patient was counseled to exercise try to lose weight Results for orders placed or performed in visit on 08/27/18  Lipid panel  Result Value Ref Range   Cholesterol, Total 175 100 - 199 mg/dL   Triglycerides 409 (H) 0 - 149 mg/dL   HDL 51 >81 mg/dL   VLDL Cholesterol Cal 34 5 - 40 mg/dL   LDL Calculated 90 0 - 99 mg/dL   Chol/HDL Ratio 3.4 0.0 - 4.4 ratio  Hepatic function panel  Result Value Ref Range   Total Protein 7.7 6.0 - 8.5 g/dL   Albumin 4.6 3.5 - 5.5 g/dL   Bilirubin Total 0.3 0.0 - 1.2 mg/dL   Bilirubin, Direct 1.91 0.00 - 0.40 mg/dL   Alkaline Phosphatase 54 39 - 117 IU/L   AST 31 0 - 40 IU/L   ALT 30 0 - 32 IU/L  Basic metabolic panel  Result Value Ref Range   Glucose 123 (H) 65 - 99 mg/dL   BUN 13 6 - 24 mg/dL   Creatinine, Ser 4.78 0.57 - 1.00 mg/dL   GFR calc non Af Amer 76 >59 mL/min/1.73   GFR calc Af Amer 88 >59 mL/min/1.73   BUN/Creatinine Ratio 15 9 - 23   Sodium 141 134 - 144 mmol/L   Potassium 3.6 3.5 - 5.2 mmol/L   Chloride 98 96 - 106 mmol/L   CO2 25 20 - 29 mmol/L   Calcium 10.1 8.7 - 10.2 mg/dL  Hemoglobin G9F  Result Value Ref Range   Hgb A1c MFr Bld 5.9 (H) 4.8 - 5.6 %   Est. average glucose Bld gHb Est-mCnc 123 mg/dL  Microalbumin / creatinine urine ratio  Result Value Ref Range   Creatinine, Urine 101.0 Not Estab. mg/dL     Microalbumin, Urine 3.6 Not Estab. ug/mL   Microalb/Creat Ratio 3.6 0.0 - 30.0 mg/g creat   Patient denies being depressed she does states she is under a lot of stress at work she tolerates it fairly well she feels that she may benefit from being on increased dose of the sertraline She does use Xanax at nighttime to help her sleep    Review of Systems  Constitutional: Negative for activity change, fatigue and fever.  HENT: Negative for congestion and rhinorrhea.   Respiratory: Negative for cough, chest tightness and shortness of breath.   Cardiovascular: Negative for chest pain and leg swelling.  Gastrointestinal: Negative for abdominal pain and nausea.  Skin: Negative for color change.  Neurological: Negative for dizziness and headaches.  Psychiatric/Behavioral: Negative for agitation and behavioral problems.       Objective:   Physical Exam Vitals signs reviewed.  Constitutional:      General: She is not in acute distress. HENT:     Head: Normocephalic and atraumatic.  Eyes:     General:        Right eye:  No discharge.        Left eye: No discharge.  Neck:     Trachea: No tracheal deviation.  Cardiovascular:     Rate and Rhythm: Normal rate and regular rhythm.     Heart sounds: Normal heart sounds. No murmur.  Pulmonary:     Effort: Pulmonary effort is normal. No respiratory distress.     Breath sounds: Normal breath sounds.  Lymphadenopathy:     Cervical: No cervical adenopathy.  Skin:    General: Skin is warm and dry.  Neurological:     Mental Status: She is alert.     Coordination: Coordination normal.  Psychiatric:        Behavior: Behavior normal.           Assessment & Plan:  Blood pressure-increase medication watch diet Morbid obesity try to lose weight Prediabetes fairly good control continue current measures Hyperlipidemia continue current measures Stress related issues continue Zoloft under a lot of stress increase it to 2 each evening Xanax  each evening to help with insomnia

## 2018-10-01 ENCOUNTER — Other Ambulatory Visit: Payer: Self-pay | Admitting: Family Medicine

## 2018-12-23 ENCOUNTER — Encounter: Payer: Self-pay | Admitting: Family Medicine

## 2018-12-24 ENCOUNTER — Other Ambulatory Visit: Payer: Self-pay

## 2018-12-24 ENCOUNTER — Ambulatory Visit (INDEPENDENT_AMBULATORY_CARE_PROVIDER_SITE_OTHER): Payer: 59 | Admitting: Family Medicine

## 2018-12-24 DIAGNOSIS — J019 Acute sinusitis, unspecified: Secondary | ICD-10-CM

## 2018-12-24 MED ORDER — AMOXICILLIN-POT CLAVULANATE 875-125 MG PO TABS: 1.0000 | ORAL_TABLET | Freq: Two times a day (BID) | ORAL | 0 refills | Status: DC

## 2018-12-24 NOTE — Telephone Encounter (Signed)
Nurses I highly recommend connecting with patient This could be a tele-visit Which would save the patient from having to get out/come into the office Please set this up with Lillia Abed for this morning

## 2018-12-24 NOTE — Telephone Encounter (Signed)
Pt contacted and placed on schedule for NP to speak with

## 2018-12-24 NOTE — Progress Notes (Signed)
   Subjective:    Patient ID: Courtney Hunter, female    DOB: 1966/06/03, 53 y.o.   MRN: [id number] Virtual visit Video and audio Patient present at home we were present at the office patient did give consent Sinusitis  This is a new problem. The current episode started in the past 7 days (3 days ago). Associated symptoms include congestion, ear pain and sinus pressure. Pertinent negatives include no chills, coughing, shortness of breath or sore throat. (Head pressure, headache, terrible allergies, no fever, no cough, no sob, no trouble breathing) Treatments tried: allergy med and Tylenol. The treatment provided mild relief.   Ear pressure, pressure behind eyes and upper teeth x 3 days. Hx of allergies. Has been taking claritin and tylenol with no relief. Some PND. Some green nasal discharge. No cough or fever. No sore throat. No trouble breathing.     Review of Systems  Constitutional: Negative for activity change, appetite change, chills and fever.  HENT: Positive for congestion, ear pain, postnasal drip, sinus pressure and sinus pain. Negative for sore throat.   Eyes: Negative for discharge.  Respiratory: Negative for cough, shortness of breath and wheezing.        Objective:   Physical Exam  Talked with patient over the phone. She is alert and oriented. Speaking in complete sentences.       Assessment & Plan:  Acute rhinosinusitis  Discussed likely sinusitis based on her symptoms. Will treat with augmentin x 10 days. Symptomatic care discussed. Warning signs discussed, if she develops fever or cough she should call us, if trouble breathing go to ED. F/u if no improvement in symptoms over the next several days. Pt verbalized understanding.

## 2019-02-03 ENCOUNTER — Other Ambulatory Visit: Payer: Self-pay | Admitting: Family Medicine

## 2019-02-14 ENCOUNTER — Telehealth: Payer: Self-pay | Admitting: Family Medicine

## 2019-02-14 DIAGNOSIS — R7303 Prediabetes: Secondary | ICD-10-CM

## 2019-02-14 DIAGNOSIS — E785 Hyperlipidemia, unspecified: Secondary | ICD-10-CM

## 2019-02-14 DIAGNOSIS — Z79899 Other long term (current) drug therapy: Secondary | ICD-10-CM

## 2019-02-14 NOTE — Telephone Encounter (Signed)
Orders put in and pt notified.  

## 2019-02-14 NOTE — Telephone Encounter (Signed)
Pt has appt set for June 18th. She would like to know if she needs to get lab work done.

## 2019-02-14 NOTE — Telephone Encounter (Signed)
Lipid, liver, metabolic 7, A1c 

## 2019-02-14 NOTE — Telephone Encounter (Signed)
Had last lab drawn on 09/09/2018 Micro albu,a1c,bmet,hepatic fx panel,lipid.Please advise.

## 2019-03-15 ENCOUNTER — Ambulatory Visit: Payer: 59 | Admitting: Family Medicine

## 2019-03-16 ENCOUNTER — Other Ambulatory Visit: Payer: Self-pay | Admitting: Family Medicine

## 2019-03-16 LAB — HEPATIC FUNCTION PANEL
ALT: 24 IU/L (ref 0–32)
AST: 27 IU/L (ref 0–40)
Albumin: 4.5 g/dL (ref 3.8–4.9)
Alkaline Phosphatase: 46 IU/L (ref 39–117)
Bilirubin Total: 0.4 mg/dL (ref 0.0–1.2)
Bilirubin, Direct: 0.13 mg/dL (ref 0.00–0.40)
Total Protein: 7 g/dL (ref 6.0–8.5)

## 2019-03-16 LAB — BASIC METABOLIC PANEL
BUN/Creatinine Ratio: 16 (ref 9–23)
BUN: 15 mg/dL (ref 6–24)
CO2: 25 mmol/L (ref 20–29)
Calcium: 9.9 mg/dL (ref 8.7–10.2)
Chloride: 94 mmol/L — ABNORMAL LOW (ref 96–106)
Creatinine, Ser: 0.91 mg/dL (ref 0.57–1.00)
GFR calc Af Amer: 83 mL/min/{1.73_m2} (ref >59)
GFR calc non Af Amer: 72 mL/min/{1.73_m2} (ref >59)
Glucose: 105 mg/dL — ABNORMAL HIGH (ref 65–99)
Potassium: 3.9 mmol/L (ref 3.5–5.2)
Sodium: 133 mmol/L — ABNORMAL LOW (ref 134–144)

## 2019-03-16 LAB — HEMOGLOBIN A1C
Est. average glucose Bld gHb Est-mCnc: 108 mg/dL
Hgb A1c MFr Bld: 5.4 % (ref 4.8–5.6)

## 2019-03-16 LAB — LIPID PANEL
Chol/HDL Ratio: 3.8 ratio (ref 0.0–4.4)
Cholesterol, Total: 145 mg/dL (ref 100–199)
HDL: 38 mg/dL — ABNORMAL LOW (ref >39)
LDL Calculated: 70 mg/dL (ref 0–99)
Triglycerides: 187 mg/dL — ABNORMAL HIGH (ref 0–149)
VLDL Cholesterol Cal: 37 mg/dL (ref 5–40)

## 2019-03-17 ENCOUNTER — Ambulatory Visit (INDEPENDENT_AMBULATORY_CARE_PROVIDER_SITE_OTHER): Payer: 59 | Admitting: Family Medicine

## 2019-03-17 ENCOUNTER — Other Ambulatory Visit: Payer: Self-pay

## 2019-03-17 VITALS — Wt 164.0 lb

## 2019-03-17 DIAGNOSIS — I10 Essential (primary) hypertension: Secondary | ICD-10-CM | POA: Diagnosis not present

## 2019-03-17 DIAGNOSIS — R7303 Prediabetes: Secondary | ICD-10-CM | POA: Diagnosis not present

## 2019-03-17 DIAGNOSIS — G4700 Insomnia, unspecified: Secondary | ICD-10-CM | POA: Diagnosis not present

## 2019-03-17 DIAGNOSIS — E785 Hyperlipidemia, unspecified: Secondary | ICD-10-CM | POA: Diagnosis not present

## 2019-03-17 MED ORDER — OMEPRAZOLE 20 MG PO CPDR: DELAYED_RELEASE_CAPSULE | ORAL | 1 refills | Status: DC

## 2019-03-17 MED ORDER — METFORMIN HCL 500 MG PO TABS: ORAL_TABLET | ORAL | 1 refills | Status: DC

## 2019-03-17 MED ORDER — PRAVASTATIN SODIUM 40 MG PO TABS: 40.0000 mg | ORAL_TABLET | Freq: Every day | ORAL | 1 refills | Status: DC

## 2019-03-17 MED ORDER — LISINOPRIL 10 MG PO TABS: ORAL_TABLET | ORAL | 1 refills | Status: DC

## 2019-03-17 MED ORDER — SERTRALINE HCL 100 MG PO TABS: ORAL_TABLET | ORAL | 1 refills | Status: DC

## 2019-03-17 MED ORDER — HYDROCHLOROTHIAZIDE 25 MG PO TABS: ORAL_TABLET | ORAL | 1 refills | Status: DC

## 2019-03-17 NOTE — Progress Notes (Signed)
Subjective:    Patient ID: Courtney Hunter, female    DOB: [birth year], 53 y.o.   MRN: [id number] Video visit Very nice patient She has done an excellent job watching her diet and staying active she is lost almost 30 pounds her lab work was reviewed with her and actually shows an improvement with the hyperlipidemia as well as the A1c Hypertension This is a chronic problem. The current episode started more than 1 year ago. Pertinent negatives include no chest pain or shortness of breath. Risk factors for coronary artery disease include dyslipidemia and post-menopausal state. Treatments tried: hctz, lisinopril.   Patient for blood pressure check up.  The patient does have hypertension.  The patient is on medication.  Patient relates compliance with meds. Todays BP reviewed with the patient. Patient denies issues with medication. Patient relates reasonable diet. Patient tries to minimize salt. Patient aware of BP goals.  Patient here for follow-up regarding cholesterol.  The patient does have hyperlipidemia.  Patient does try to maintain a reasonable diet.  Patient does take the medication on a regular basis.  Denies missing a dose.  The patient denies any obvious side effects.  Prior blood work results reviewed with the patient.  The patient is aware of his cholesterol goals and the need to keep it under good control to lessen the risk of disease.  The patient was seen today as part of a comprehensive diabetic check up.the patient does have diabetes.  The patient follows here on a regular basis.  The patient relates medication compliance. No significant side effects to the medications. Denies any low glucose spells. Relates compliance with diet to a reasonable level. Patient does do labwork intermittently and understands the dangers of diabetes.  Patient suffers with insomnia.  This is been going on for a while.  The patient finds it necessary to use medication to help sleep.  Patient finds it if not  using medication has significant troubles.  Denies abusing the medication.  Denies any negative side effects.   Virtual Visit via Video Note  I connected with Courtney HsuHarriet S Hays on 03/17/19 at  9:00 AM EDT by a video enabled telemedicine application and verified that I am speaking with the correct person using two identifiers.  Location: Patient: home Provider: office   I discussed the limitations of evaluation and management by telemedicine and the availability of in person appointments. The patient expressed understanding and agreed to proceed.  History of Present Illness:    Observations/Objective:   Assessment and Plan:   Follow Up Instructions:    I discussed the assessment and treatment plan with the patient. The patient was provided an opportunity to ask questions and all were answered. The patient agreed with the plan and demonstrated an understanding of the instructions.   The patient was advised to call back or seek an in-person evaluation if the symptoms worsen or if the condition fails to improve as anticipated.  I provided 25 minutes of non-face-to-face time during this encounter.   25 minutes was spent with the patient.  This statement verifies that 25 minutes was indeed spent with the patient.  More than 50% of this visit-total duration of the visit-was spent in counseling and coordination of care. The issues that the patient came in for today as reflected in the diagnosis (s) please refer to documentation for further details.    Review of Systems  Constitutional: Negative for activity change, appetite change and fatigue.  HENT: Negative for congestion and  rhinorrhea.   Respiratory: Negative for cough and shortness of breath.   Cardiovascular: Negative for chest pain and leg swelling.  Gastrointestinal: Negative for abdominal pain and diarrhea.  Endocrine: Negative for polydipsia and polyphagia.  Skin: Negative for color change.  Neurological: Negative for  dizziness and weakness.  Psychiatric/Behavioral: Negative for behavioral problems and confusion.       Objective:   Physical Exam   Patient had virtual visit Appears to be in no distress Atraumatic Neuro able to relate and oriented No apparent resp distress Color normal      Assessment & Plan:  Very impressed with the patient's progress Bringing her weight down significantly She will start adding in some walking for exercise She will continue her blood pressure medicine although she may not necessarily need the diuretic she will try monitoring her blood pressure and potentially coming off of this Diabetes under good control A1c looks good she will do her metformin on a regular basis She will continue her cholesterol medicine She will do a follow-up visit in person in November  Patient does have insomnia she uses her Xanax for that doing well without refills given

## 2019-08-11 ENCOUNTER — Telehealth: Payer: Self-pay | Admitting: Family Medicine

## 2019-08-11 DIAGNOSIS — E785 Hyperlipidemia, unspecified: Secondary | ICD-10-CM

## 2019-08-11 DIAGNOSIS — Z79899 Other long term (current) drug therapy: Secondary | ICD-10-CM

## 2019-08-11 DIAGNOSIS — I10 Essential (primary) hypertension: Secondary | ICD-10-CM

## 2019-08-11 DIAGNOSIS — R7303 Prediabetes: Secondary | ICD-10-CM

## 2019-08-11 NOTE — Telephone Encounter (Signed)
Last labs 03/15/19: Lipid, Liver, Met 7 and hgbA1c

## 2019-08-11 NOTE — Telephone Encounter (Signed)
Blood work ordered in Epic. Patient notified. 

## 2019-08-11 NOTE — Telephone Encounter (Signed)
CBC, lipid, liver, metabolic 7, urine ACR, hemoglobin A1c  Hypertension hyperlipidemia diabetes

## 2019-08-11 NOTE — Telephone Encounter (Signed)
Pt would like to know if she needs to have lab work done before appt on 12/14

## 2019-09-06 ENCOUNTER — Other Ambulatory Visit: Payer: Self-pay | Admitting: Family Medicine

## 2019-09-09 ENCOUNTER — Other Ambulatory Visit: Payer: Self-pay | Admitting: Family Medicine

## 2019-09-12 ENCOUNTER — Ambulatory Visit (INDEPENDENT_AMBULATORY_CARE_PROVIDER_SITE_OTHER): Payer: 59 | Admitting: Family Medicine

## 2019-09-12 ENCOUNTER — Other Ambulatory Visit: Payer: Self-pay

## 2019-09-12 DIAGNOSIS — I10 Essential (primary) hypertension: Secondary | ICD-10-CM | POA: Diagnosis not present

## 2019-09-12 DIAGNOSIS — E785 Hyperlipidemia, unspecified: Secondary | ICD-10-CM

## 2019-09-12 DIAGNOSIS — R7303 Prediabetes: Secondary | ICD-10-CM | POA: Diagnosis not present

## 2019-09-12 MED ORDER — ALPRAZOLAM 1 MG PO TABS: 1.0000 mg | ORAL_TABLET | Freq: Every evening | ORAL | 5 refills | Status: DC | PRN

## 2019-09-12 MED ORDER — PRAVASTATIN SODIUM 40 MG PO TABS: 40.0000 mg | ORAL_TABLET | Freq: Every day | ORAL | 1 refills | Status: DC

## 2019-09-12 MED ORDER — LISINOPRIL 10 MG PO TABS: ORAL_TABLET | ORAL | 1 refills | Status: DC

## 2019-09-12 MED ORDER — HYDROCHLOROTHIAZIDE 25 MG PO TABS: ORAL_TABLET | ORAL | 1 refills | Status: DC

## 2019-09-12 MED ORDER — SERTRALINE HCL 100 MG PO TABS: ORAL_TABLET | ORAL | 1 refills | Status: DC

## 2019-09-12 MED ORDER — METFORMIN HCL 500 MG PO TABS: ORAL_TABLET | ORAL | 1 refills | Status: DC

## 2019-09-12 NOTE — Progress Notes (Signed)
Flu shot has been documented in chart.

## 2019-09-12 NOTE — Progress Notes (Signed)
   Subjective:    Patient ID: Courtney Hunter, female    DOB: 08/08/1966, 53 y.o.   MRN: [id number]  Hypertension This is a chronic problem. The current episode started more than 1 year ago. Pertinent negatives include no chest pain or shortness of breath. Risk factors for coronary artery disease include post-menopausal state. Treatments tried: lisinopril, hctz. There are no compliance problems.    Patient does have obesity she is working hard on diet and trying to stay active She does take her medicines on a regular basis denies any setbacks recently Best she knows blood pressure is doing well Her moods overall are doing well Trying to stay safe from Covid Her son is doing teaching in [place].20   Review of Systems  Constitutional: Negative for activity change, appetite change and fatigue.  HENT: Negative for congestion and rhinorrhea.   Respiratory: Negative for cough and shortness of breath.   Cardiovascular: Negative for chest pain and leg swelling.  Gastrointestinal: Negative for abdominal pain and diarrhea.  Endocrine: Negative for polydipsia and polyphagia.  Skin: Negative for color change.  Neurological: Negative for dizziness and weakness.  Psychiatric/Behavioral: Negative for behavioral problems and confusion.   Virtual Visit via Video Note  I connected with Courtney Hunter on 09/12/19 at  8:30 AM EST by a video enabled telemedicine application and verified that I am speaking with the correct person using two identifiers.  Location: Patient: home Provider: office   I discussed the limitations of evaluation and management by telemedicine and the availability of in person appointments. The patient expressed understanding and agreed to proceed.  History of Present Illness:    Observations/Objective:   Assessment and Plan:   Follow Up Instructions:    I discussed the assessment and treatment plan with the patient. The patient was provided an opportunity to ask  questions and all were answered. The patient agreed with the plan and demonstrated an understanding of the instructions.   The patient was advised to call back or seek an in-person evaluation if the symptoms worsen or if the condition fails to improve as anticipated.  I provided 20 minutes of non-face-to-face time during this encounter.        Objective:   Physical Exam Patient had virtual visit Appears to be in no distress Atraumatic Neuro able to relate and oriented No apparent resp distress Color normal        Assessment & Plan:  1. Essential hypertension, benign Blood pressure good control continue current measures  2. Hyperlipidemia, unspecified hyperlipidemia type Cholesterol overall good control continue current measures previous labs reviewed no need for new labs Follow-up in approximately 6 months  3. Prediabetes Overall doing well with medicine follow-up in approximately 6 months watch diet stay active

## 2020-02-01 ENCOUNTER — Other Ambulatory Visit: Payer: Self-pay | Admitting: *Deleted

## 2020-02-01 ENCOUNTER — Telehealth: Payer: Self-pay | Admitting: Family Medicine

## 2020-02-01 DIAGNOSIS — Z79899 Other long term (current) drug therapy: Secondary | ICD-10-CM

## 2020-02-01 DIAGNOSIS — I10 Essential (primary) hypertension: Secondary | ICD-10-CM

## 2020-02-01 DIAGNOSIS — R7303 Prediabetes: Secondary | ICD-10-CM

## 2020-02-01 DIAGNOSIS — E785 Hyperlipidemia, unspecified: Secondary | ICD-10-CM

## 2020-02-01 NOTE — Telephone Encounter (Signed)
Last labs 02/2019: Lipid, Liver, Met 7 and HgbA1c

## 2020-02-01 NOTE — Telephone Encounter (Signed)
Patient has a 6 month follow-up scheduled for 6/7 and would like lab work done before appt.

## 2020-02-01 NOTE — Telephone Encounter (Signed)
Lipid, liver, metabolic 7, A1c, urine ACR °

## 2020-02-01 NOTE — Telephone Encounter (Signed)
Labs ordered and patient notified.

## 2020-02-07 ENCOUNTER — Encounter: Payer: Self-pay | Admitting: Family Medicine

## 2020-02-07 ENCOUNTER — Ambulatory Visit: Payer: 59 | Admitting: Family Medicine

## 2020-02-07 ENCOUNTER — Other Ambulatory Visit: Payer: Self-pay

## 2020-02-07 VITALS — BP 138/86 | HR 77 | Temp 97.9°F | Ht 60.0 in | Wt 185.8 lb

## 2020-02-07 DIAGNOSIS — N3001 Acute cystitis with hematuria: Secondary | ICD-10-CM | POA: Diagnosis not present

## 2020-02-07 LAB — POCT URINALYSIS DIPSTICK
Blood, UA: POSITIVE
Spec Grav, UA: 1.01 (ref 1.010–1.025)
pH, UA: 7 (ref 5.0–8.0)

## 2020-02-07 MED ORDER — PHENAZOPYRIDINE HCL 95 MG PO TABS
95.0000 mg | ORAL_TABLET | Freq: Three times a day (TID) | ORAL | 0 refills | Status: DC | PRN
Start: 2020-02-07 — End: 2020-03-05

## 2020-02-07 MED ORDER — CEPHALEXIN 500 MG PO CAPS
500.0000 mg | ORAL_CAPSULE | Freq: Two times a day (BID) | ORAL | 0 refills | Status: DC
Start: 2020-02-07 — End: 2020-02-10

## 2020-02-07 NOTE — Progress Notes (Signed)
Patient ID: Courtney Hunter, female    DOB: Nov 02, 1965, 54 y.o.   MRN: [id number]   Chief Complaint  Patient presents with  . Urinary Tract Infection   Subjective:    HPI  Pt seen for dysuria.  pt has noticed some blood in urine, pain with urination and frequency that started yesterday.  Hasn't had UTI for years.  Tried drinking cranberry juice.  Drinks lots of water usually, but has been doing that also. No fever, n/v/d, abd pain, or back pain.   Results for orders placed or performed in visit on 02/07/20  POCT urinalysis dipstick  Result Value Ref Range   Color, UA     Clarity, UA     Glucose, UA     Bilirubin, UA     Ketones, UA     Spec Grav, UA 1.010 1.010 - 1.025   Blood, UA positive    pH, UA 7.0 5.0 - 8.0   Protein, UA     Urobilinogen, UA     Nitrite, UA     Leukocytes, UA Trace (A) Negative   Appearance     Odor         Medical History Courtney Hunter has a past medical history of Allergy, Diabetes mellitus without complication (HCC), Hyperlipidemia, Hypertension, ICS (immotile cilia syndrome), and Reactive airway disease.   Outpatient Encounter Medications as of 02/07/2020  Medication Sig  . albuterol (VENTOLIN HFA) 108 (90 Base) MCG/ACT inhaler USE 2 PUFFS INTO LUNGS EVERY 4 HOURS AS NEEDED FOR WHEEZING OR SHORTNESS OF BREATH  . ALPRAZolam (XANAX) 1 MG tablet Take 1 tablet (1 mg total) by mouth at bedtime as needed. for sleep  . aspirin 81 MG tablet Take 81 mg by mouth daily.  . fish oil-omega-3 fatty acids 1000 MG capsule Take 2 g by mouth daily.  . fluticasone (FLONASE) 50 MCG/ACT nasal spray Place 2 sprays into both nostrils daily.  . hydrochlorothiazide (HYDRODIURIL) 25 MG tablet TAKE ONE (1) TABLET BY MOUTH EVERY DAY  . lisinopril (ZESTRIL) 10 MG tablet TAKE ONE (1) TABLET BY MOUTH EVERY DAY  . metFORMIN (GLUCOPHAGE) 500 MG tablet One pill  a day, with meal  . omeprazole (PRILOSEC) 20 MG capsule TAKE ONE CAPSULE BY MOUTH DAILY  . pravastatin (PRAVACHOL)  40 MG tablet Take 1 tablet (40 mg total) by mouth daily.  . sertraline (ZOLOFT) 100 MG tablet 1.5 tablets qd  . cephALEXin (KEFLEX) 500 MG capsule Take 1 capsule (500 mg total) by mouth 2 (two) times daily.  . phenazopyridine (PYRIDIUM) 95 MG tablet Take 1 tablet (95 mg total) by mouth 3 (three) times daily as needed for pain.   No facility-administered encounter medications on file as of 02/07/2020.     Review of Systems  Constitutional: Negative for chills and fever.  Gastrointestinal: Negative for abdominal pain, constipation, diarrhea, nausea and vomiting.  Genitourinary: Positive for dysuria, frequency and hematuria. Negative for difficulty urinating, enuresis, flank pain, pelvic pain, urgency, vaginal bleeding, vaginal discharge and vaginal pain.  Musculoskeletal: Negative for back pain.  Skin: Negative for rash.     Vitals BP 138/86   Pulse 77   Temp 97.9 F (36.6 C)   Ht 5' (1.524 m)   Wt 185 lb 12.8 oz (84.3 kg)   SpO2 100%   BMI 36.29 kg/m   Objective:   Physical Exam Constitutional:      General: She is not in acute distress.    Appearance: Normal appearance. She is not ill-appearing.  Cardiovascular:     Rate and Rhythm: Normal rate and regular rhythm.     Pulses: Normal pulses.     Heart sounds: Normal heart sounds.  Pulmonary:     Effort: Pulmonary effort is normal. No respiratory distress.     Breath sounds: Normal breath sounds.  Abdominal:     General: Bowel sounds are normal. There is no distension.     Palpations: Abdomen is soft. There is no mass.     Tenderness: There is no abdominal tenderness. There is no right CVA tenderness, left CVA tenderness, guarding or rebound.     Hernia: No hernia is present.  Skin:    General: Skin is warm and dry.  Neurological:     General: No focal deficit present.     Mental Status: She is alert and oriented to person, place, and time.  Psychiatric:        Mood and Affect: Mood normal.        Behavior: Behavior  normal.      Assessment and Plan   1. Acute cystitis with hematuria - POCT urinalysis dipstick - Urine Culture    Pt given keflex bid for 7 days and pyridium prn. Call or rto if worsening.  Pt in agreement with plan. F/u prn.  [place], DO 02/07/2020

## 2020-02-09 LAB — URINE CULTURE

## 2020-02-10 ENCOUNTER — Telehealth: Payer: Self-pay | Admitting: Family Medicine

## 2020-02-10 MED ORDER — NITROFURANTOIN MONOHYD MACRO 100 MG PO CAPS
100.0000 mg | ORAL_CAPSULE | Freq: Two times a day (BID) | ORAL | 0 refills | Status: DC
Start: 2020-02-10 — End: 2020-03-05

## 2020-02-10 NOTE — Telephone Encounter (Signed)
Patient notified and verbalized understanding. 

## 2020-02-10 NOTE — Telephone Encounter (Signed)
lmtc

## 2020-02-10 NOTE — Telephone Encounter (Signed)
-----   Message from Metro Kung, LPN sent at 5/99/7741 10:28 AM EDT ----- Pt would like antibiotic sent to [facility]

## 2020-03-01 LAB — BASIC METABOLIC PANEL
BUN/Creatinine Ratio: 20 (ref 9–23)
BUN: 18 mg/dL (ref 6–24)
CO2: 26 mmol/L (ref 20–29)
Calcium: 10.1 mg/dL (ref 8.7–10.2)
Chloride: 98 mmol/L (ref 96–106)
Creatinine, Ser: 0.9 mg/dL (ref 0.57–1.00)
GFR calc Af Amer: 83 mL/min/{1.73_m2} (ref >59)
GFR calc non Af Amer: 72 mL/min/{1.73_m2} (ref >59)
Glucose: 112 mg/dL — ABNORMAL HIGH (ref 65–99)
Potassium: 4.4 mmol/L (ref 3.5–5.2)
Sodium: 138 mmol/L (ref 134–144)

## 2020-03-01 LAB — HEPATIC FUNCTION PANEL
ALT: 23 IU/L (ref 0–32)
AST: 23 IU/L (ref 0–40)
Albumin: 4.5 g/dL (ref 3.8–4.9)
Alkaline Phosphatase: 56 IU/L (ref 48–121)
Bilirubin Total: 0.3 mg/dL (ref 0.0–1.2)
Bilirubin, Direct: 0.11 mg/dL (ref 0.00–0.40)
Total Protein: 7.3 g/dL (ref 6.0–8.5)

## 2020-03-01 LAB — LIPID PANEL
Chol/HDL Ratio: 4 ratio (ref 0.0–4.4)
Cholesterol, Total: 192 mg/dL (ref 100–199)
HDL: 48 mg/dL (ref >39)
LDL Chol Calc (NIH): 106 mg/dL — ABNORMAL HIGH (ref 0–99)
Triglycerides: 219 mg/dL — ABNORMAL HIGH (ref 0–149)
VLDL Cholesterol Cal: 38 mg/dL (ref 5–40)

## 2020-03-01 LAB — HEMOGLOBIN A1C
Est. average glucose Bld gHb Est-mCnc: 120 mg/dL
Hgb A1c MFr Bld: 5.8 % — ABNORMAL HIGH (ref 4.8–5.6)

## 2020-03-01 LAB — MICROALBUMIN / CREATININE URINE RATIO
Creatinine, Urine: 89.9 mg/dL
Microalb/Creat Ratio: 20 mg/g creat (ref 0–29)
Microalbumin, Urine: 18.3 ug/mL

## 2020-03-05 ENCOUNTER — Ambulatory Visit (INDEPENDENT_AMBULATORY_CARE_PROVIDER_SITE_OTHER): Payer: 59 | Admitting: Family Medicine

## 2020-03-05 ENCOUNTER — Other Ambulatory Visit: Payer: Self-pay

## 2020-03-05 ENCOUNTER — Encounter: Payer: Self-pay | Admitting: Family Medicine

## 2020-03-05 VITALS — BP 130/84 | Temp 98.1°F | Wt 181.4 lb

## 2020-03-05 DIAGNOSIS — I739 Peripheral vascular disease, unspecified: Secondary | ICD-10-CM | POA: Diagnosis not present

## 2020-03-05 DIAGNOSIS — I10 Essential (primary) hypertension: Secondary | ICD-10-CM | POA: Diagnosis not present

## 2020-03-05 DIAGNOSIS — E785 Hyperlipidemia, unspecified: Secondary | ICD-10-CM | POA: Diagnosis not present

## 2020-03-05 DIAGNOSIS — Z1211 Encounter for screening for malignant neoplasm of colon: Secondary | ICD-10-CM

## 2020-03-05 DIAGNOSIS — R7303 Prediabetes: Secondary | ICD-10-CM

## 2020-03-05 MED ORDER — PRAVASTATIN SODIUM 40 MG PO TABS: 40.0000 mg | ORAL_TABLET | Freq: Every day | ORAL | 1 refills | Status: DC

## 2020-03-05 MED ORDER — HYDROCHLOROTHIAZIDE 25 MG PO TABS: ORAL_TABLET | ORAL | 1 refills | Status: DC

## 2020-03-05 MED ORDER — OMEPRAZOLE 20 MG PO CPDR: 20.0000 mg | DELAYED_RELEASE_CAPSULE | Freq: Every day | ORAL | 1 refills | Status: DC

## 2020-03-05 MED ORDER — LISINOPRIL 10 MG PO TABS: ORAL_TABLET | ORAL | 1 refills | Status: DC

## 2020-03-05 MED ORDER — ALPRAZOLAM 1 MG PO TABS: 1.0000 mg | ORAL_TABLET | Freq: Every evening | ORAL | 5 refills | Status: DC | PRN

## 2020-03-05 MED ORDER — METFORMIN HCL 500 MG PO TABS: ORAL_TABLET | ORAL | 1 refills | Status: DC

## 2020-03-05 MED ORDER — SERTRALINE HCL 100 MG PO TABS: ORAL_TABLET | ORAL | 1 refills | Status: DC

## 2020-03-05 NOTE — Progress Notes (Signed)
   Subjective:    Patient ID: Courtney Hunter, female    DOB: [birth year], 54 y.o.   MRN: [id number]  Hypertension This is a chronic problem. Pertinent negatives include no chest pain or shortness of breath. Treatments tried: lisinopril and hctz. Compliance problems include exercise (patient would like to dicuss right leg pain with exertion. When walking a good ways right leg starts to hurt and feel heavy. Diet is decent. ).   Hyperlipidemia This is a chronic problem. Pertinent negatives include no chest pain or shortness of breath. Treatments tried: pravastatin.   Essential hypertension, benign  Hyperlipidemia, unspecified hyperlipidemia type  Prediabetes  Right leg claudication (HCC) - Plan: US ARTERIAL ABI (SCREENING LOWER EXTREMITY)  Screen for colon cancer - Plan: Ambulatory referral to Gastroenterology    Covid vaccines complete.   Review of Systems  Constitutional: Negative for activity change, appetite change and fatigue.  HENT: Negative for congestion and rhinorrhea.   Respiratory: Negative for cough and shortness of breath.   Cardiovascular: Negative for chest pain and leg swelling.  Gastrointestinal: Negative for abdominal pain and diarrhea.  Endocrine: Negative for polydipsia and polyphagia.  Skin: Negative for color change.  Neurological: Negative for dizziness and weakness.  Psychiatric/Behavioral: Negative for behavioral problems and confusion.       Objective:   Physical Exam Vitals reviewed.  Constitutional:      General: She is not in acute distress. HENT:     Head: Normocephalic and atraumatic.  Eyes:     General:        Right eye: No discharge.        Left eye: No discharge.  Neck:     Trachea: No tracheal deviation.  Cardiovascular:     Rate and Rhythm: Normal rate and regular rhythm.     Heart sounds: Normal heart sounds. No murmur.  Pulmonary:     Effort: Pulmonary effort is normal. No respiratory distress.     Breath sounds: Normal breath  sounds.  Lymphadenopathy:     Cervical: No cervical adenopathy.  Skin:    General: Skin is warm and dry.  Neurological:     Mental Status: She is alert.     Coordination: Coordination normal.  Psychiatric:        Behavior: Behavior normal.    She describes right leg pain discomfort with activity states her lower leg aching in the muscle region when she stops it gets better when she starts back up it resumes has the appearance of possible claudication       Assessment & Plan:  1. Essential hypertension, benign Blood pressure under good control continue current measures watch diet stay active  2. Hyperlipidemia, unspecified hyperlipidemia type Hyperlipidemia continue medication watch diet recheck labs again 6 to 9 months  3. Prediabetes Prediabetes decent control could be better work hard on diet and activity  4. Right leg claudication (HCC) Intermittent right leg claudication check ABI if this is normal then it is possible this could be compartment syndrome may need orthopedic referral - US ARTERIAL ABI (SCREENING LOWER EXTREMITY)  5. Screen for colon cancer Very important to do screening for colon cancer patient interested in doing so later this summer  Significant obesity watch diet watch portions stay active - Ambulatory referral to Gastroenterology

## 2020-03-16 ENCOUNTER — Encounter: Payer: Self-pay | Admitting: Family Medicine

## 2020-03-19 ENCOUNTER — Ambulatory Visit (HOSPITAL_COMMUNITY)
Admission: RE | Admit: 2020-03-19 | Discharge: 2020-03-19 | Disposition: A | Discharge status: Home / Self Care | Payer: 59 | Source: Ambulatory Visit | Attending: Family Medicine | Admitting: Family Medicine

## 2020-03-19 ENCOUNTER — Other Ambulatory Visit: Payer: Self-pay

## 2020-03-19 DIAGNOSIS — I739 Peripheral vascular disease, unspecified: Secondary | ICD-10-CM | POA: Diagnosis not present

## 2020-03-22 ENCOUNTER — Encounter: Payer: Self-pay | Admitting: Family Medicine

## 2020-03-22 ENCOUNTER — Other Ambulatory Visit: Payer: Self-pay | Admitting: *Deleted

## 2020-03-22 DIAGNOSIS — I739 Peripheral vascular disease, unspecified: Secondary | ICD-10-CM

## 2020-04-03 ENCOUNTER — Encounter: Payer: Self-pay | Admitting: Family Medicine

## 2020-05-10 ENCOUNTER — Encounter: Payer: 59 | Admitting: Vascular Surgery

## 2020-06-11 ENCOUNTER — Encounter: Payer: Self-pay | Admitting: Family Medicine

## 2020-06-21 ENCOUNTER — Ambulatory Visit: Payer: 59 | Admitting: Vascular Surgery

## 2020-06-21 ENCOUNTER — Other Ambulatory Visit: Payer: Self-pay

## 2020-06-21 ENCOUNTER — Encounter: Payer: Self-pay | Admitting: Vascular Surgery

## 2020-06-21 VITALS — BP 124/69 | HR 74 | Temp 98.3°F | Resp 20 | Ht 60.0 in | Wt 189.0 lb

## 2020-06-21 DIAGNOSIS — I739 Peripheral vascular disease, unspecified: Secondary | ICD-10-CM | POA: Diagnosis not present

## 2020-06-21 DIAGNOSIS — M79604 Pain in right leg: Secondary | ICD-10-CM | POA: Diagnosis not present

## 2020-06-21 NOTE — Progress Notes (Signed)
Referring Physician: Dr. Lilyan Punt  Patient name: Courtney Hunter MRN: [id number] DOB: Dec 13, 1965 Sex: female  REASON FOR CONSULT: Right leg pain  HPI: REIS PIENTA is a 54 y.o. female, with a several month history of right leg pain that occurs primarily with walking.  This occurs usually after walking about 3 or 4 blocks.  She states she used to be able to walk much further with no pain and the symptoms have gotten worse over the last month or so.  He states that the pain occurs in her right thigh and extends all the way down into her right calf.  She denies any numbness or tingling of her foot.  She does have a prior history of borderline diabetes and has been on Metformin.  She has a family history of varicose veins in her mother and several other relatives.  Her mother also had to have an IVC filter for DVT.  She also had a brother who had a DVT.  She does not really complain of any swelling in her lower extremities.  She has no personal history of DVT.  She has never really worn compression stockings.  She also does have chronic back pain.  She describes the pain in her right leg is full heavy and achy primarily in the thigh and right lateral calf.  This gets somewhat worse as the day progresses but is not really consistent.  She has not really noticed much difference if she elevates the leg.  Other medical problems include hyperlipidemia hypertension both of which are controlled.  She is on aspirin and a statin.  She has never used tobacco.  Past Medical History:  Diagnosis Date  . Allergy   . Diabetes mellitus without complication (HCC)    Type II  . Hyperlipidemia   . Hypertension   . ICS (immotile cilia syndrome)   . Reactive airway disease    Past Surgical History:  Procedure Laterality Date  . ABLATION    . APPENDECTOMY    . DILATION AND CURETTAGE OF UTERUS      History reviewed. No pertinent family history.  SOCIAL HISTORY: Social History   Socioeconomic  History  . Marital status: Married    Spouse name: Not on file  . Number of children: Not on file  . Years of education: Not on file  . Highest education level: Not on file  Occupational History  . Not on file  Tobacco Use  . Smoking status: Never Smoker  . Smokeless tobacco: Never Used  Vaping Use  . Vaping Use: Never used  Substance and Sexual Activity  . Alcohol use: Never  . Drug use: Never  . Sexual activity: Not on file  Other Topics Concern  . Not on file  Social History Narrative  . Not on file   Social Determinants of Health   Financial Resource Strain:   . Difficulty of Paying Living Expenses: Not on file  Food Insecurity:   . Worried About Programme researcher, broadcasting/film/video in the Last Year: Not on file  . Ran Out of Food in the Last Year: Not on file  Transportation Needs:   . Lack of Transportation (Medical): Not on file  . Lack of Transportation (Non-Medical): Not on file  Physical Activity:   . Days of Exercise per Week: Not on file  . Minutes of Exercise per Session: Not on file  Stress:   . Feeling of Stress : Not on file  Social Connections:   .  Frequency of Communication with Friends and Family: Not on file  . Frequency of Social Gatherings with Friends and Family: Not on file  . Attends Religious Services: Not on file  . Active Member of Clubs or Organizations: Not on file  . Attends Banker Meetings: Not on file  . Marital Status: Not on file  Intimate Partner Violence:   . Fear of Current or Ex-Partner: Not on file  . Emotionally Abused: Not on file  . Physically Abused: Not on file  . Sexually Abused: Not on file    Allergies  Allergen Reactions  . Sulfur   . Voltaren [Diclofenac Sodium]     Current Outpatient Medications  Medication Sig Dispense Refill  . albuterol (VENTOLIN HFA) 108 (90 Base) MCG/ACT inhaler USE 2 PUFFS INTO LUNGS EVERY 4 HOURS AS NEEDED FOR WHEEZING OR SHORTNESS OF BREATH 18 g 5  . ALPRAZolam (XANAX) 1 MG tablet Take  1 tablet (1 mg total) by mouth at bedtime as needed. for sleep 30 tablet 5  . aspirin 81 MG tablet Take 81 mg by mouth daily.    . Cyanocobalamin (VITAMIN B 12 PO) Take by mouth.    . fish oil-omega-3 fatty acids 1000 MG capsule Take 2 g by mouth daily.    Marland Kitchen glucosamine-chondroitin 500-400 MG tablet Take 1 tablet by mouth 3 (three) times daily.    . hydrochlorothiazide (HYDRODIURIL) 25 MG tablet TAKE ONE (1) TABLET BY MOUTH EVERY DAY 90 tablet 1  . lisinopril (ZESTRIL) 10 MG tablet TAKE ONE (1) TABLET BY MOUTH EVERY DAY 90 tablet 1  . metFORMIN (GLUCOPHAGE) 500 MG tablet One pill  a day, with meal 90 tablet 1  . omeprazole (PRILOSEC) 20 MG capsule Take 1 capsule (20 mg total) by mouth daily. 90 capsule 1  . pravastatin (PRAVACHOL) 40 MG tablet Take 1 tablet (40 mg total) by mouth daily. 90 tablet 1  . sertraline (ZOLOFT) 100 MG tablet 1.5 tablets qd 135 tablet 1   No current facility-administered medications for this visit.    ROS:   General:  No weight loss, Fever, chills  HEENT: No recent headaches, no nasal bleeding, no visual changes, no sore throat  Neurologic: No dizziness, blackouts, seizures. No recent symptoms of stroke or mini- stroke. No recent episodes of slurred speech, or temporary blindness.  Cardiac: No recent episodes of chest pain/pressure, no shortness of breath at rest.  No shortness of breath with exertion.  Denies history of atrial fibrillation or irregular heartbeat  Vascular: No history of rest pain in feet.  No history of claudication.  No history of non-healing ulcer, No history of DVT   Pulmonary: No home oxygen, no productive cough, no hemoptysis,  No asthma or wheezing  Musculoskeletal:  [ ]  Arthritis, [X]  Low back pain,  [ ]  Joint pain  Hematologic:No history of hypercoagulable state.  No history of easy bleeding.  No history of anemia  Gastrointestinal: No hematochezia or melena,  No gastroesophageal reflux, no trouble swallowing  Urinary: [ ]  chronic  Kidney disease, [ ]  on HD - [ ]  MWF or [ ]  TTHS, [ ]  Burning with urination, [ ]  Frequent urination, [ ]  Difficulty urinating;   Skin: No rashes  Psychological: No history of anxiety,  No history of depression   Physical Examination  Vitals:   06/21/20 0943  BP: 124/69  Pulse: 74  Resp: 20  Temp: 98.3 F (36.8 C)  SpO2: 97%  Weight: 189 lb (85.7 kg)  Height:  5' (1.524 m)    Body mass index is 36.91 kg/m.  General:  Alert and oriented, no acute distress HEENT: Normal Neck: No JVD Cardiac: Regular Rate and Rhythm Abdomen: Soft, non-tender, non-distended, no mass Skin: No rash Extremity Pulses:  2+ radial, brachial, femoral, absent dorsalis pedis, 1+ posterior tibial pulses bilaterally Musculoskeletal: No deformity or edema  Neurologic: Upper and lower extremity motor 5/5 and symmetric  DATA:  I reviewed the patient's recent ABIs from AT [facility] which were 0.99 on the right 0.92 on the left this is dated June 2021.  ASSESSMENT: Right leg pain.  She does have evidence on ABIs and her clinical exam of mild atherosclerotic change but I do not believe her ABIs are consistent with her right leg symptoms.  Usually to elicit claudication symptoms the ABI will have to be less than 0.8.  She does have a faintly palpable posterior tibial pulse bilaterally.  She could have a venous component to her right leg pain.  This also may be more musculoskeletal and related to her back.   PLAN: Patient was given a recommendation today for lower extremity compression stockings.  She will try 20 to 30 mm knee-high stockings.  We will schedule her for a right lower extremity reflux exam to evaluate her venous system in a few weeks.  We will make further recommendations based on this.   Fabienne Bruns, MD [facility] Office: [phone]

## 2020-06-22 ENCOUNTER — Other Ambulatory Visit: Payer: Self-pay

## 2020-06-22 DIAGNOSIS — I8390 Asymptomatic varicose veins of unspecified lower extremity: Secondary | ICD-10-CM

## 2020-07-05 ENCOUNTER — Other Ambulatory Visit: Payer: Self-pay

## 2020-07-05 ENCOUNTER — Encounter: Payer: Self-pay | Admitting: Vascular Surgery

## 2020-07-05 ENCOUNTER — Ambulatory Visit: Payer: 59 | Admitting: Vascular Surgery

## 2020-07-05 ENCOUNTER — Ambulatory Visit (HOSPITAL_COMMUNITY)
Admission: RE | Admit: 2020-07-05 | Discharge: 2020-07-05 | Disposition: A | Discharge status: Home / Self Care | Payer: 59 | Source: Ambulatory Visit | Attending: Vascular Surgery | Admitting: Vascular Surgery

## 2020-07-05 VITALS — BP 134/80 | HR 69 | Temp 98.3°F | Resp 20 | Ht 60.0 in | Wt 186.0 lb

## 2020-07-05 DIAGNOSIS — I8390 Asymptomatic varicose veins of unspecified lower extremity: Secondary | ICD-10-CM | POA: Insufficient documentation

## 2020-07-05 DIAGNOSIS — M79604 Pain in right leg: Secondary | ICD-10-CM

## 2020-07-05 NOTE — Progress Notes (Signed)
Patient is a 54 year old female who returns for follow-up today.  He was last seen June 21, 2020.  Her primary complaint is right leg pain.  Pain usually occurs with walking.  She states that sometimes the pain will occur down the posterior aspect of her right thigh and all the way into her calf.  She does not really have any problems in the right foot.  She does not have swelling.  Pain is usually brought on by walking but sometimes can occur while she is sitting still.  She has some chronic low back pain but this is not really bothersome to her.  Pain is essentially unchanged from her office visit September 23.    Physical exam:  Vitals:   07/05/20 1447  BP: 134/80  Pulse: 69  Resp: 20  Temp: 98.3 F (36.8 C)  SpO2: 96%  Weight: 186 lb (84.4 kg)  Height: 5' (1.524 m)    Extremities: Absent dorsalis pedis pulse 2+ posterior tibial pulse right foot  Data: Patient had a right lower extremity reflux study performed today.  This showed no evidence of DVT.  She did have reflux fairly diffusely in the right greater saphenous vein throughout the thigh.  Vein diameter was 5 to 8 mm.  Assessment: Patient does have evidence of venous reflux in her right greater saphenous vein.  However, this does not usually cause right posterior thigh pain or the type of calf pain that she is describing.  I am not convinced that her pain is related to the reflux.  I discussed with her today that may be looking for a musculoskeletal etiology would be the next step.  If all other etiologies are exhausted we could revisit whether or not to consider a laser ablation of her right greater saphenous vein.  This would make more sense if she has had heaviness fullness and aching in the leg that progressively worsens after standing on her feet all day and it is rapidly improved after a night of rest and elevation.  She currently does not really have these exact symptoms.  She does not have significant arterial occlusive  disease has she had essentially normal ABIs and has a palpable posterior tibial pulse in the right foot.  Plan: Patient could consider wearing some compression stockings but I am not sure her symptoms are all relative to this.  She will follow up with her primary care physician regarding other possible etiologies.  She will return on an as-needed basis if no other etiology can be found for her pain.  Fabienne Bruns, MD [facility] [place] Office: [phone]

## 2020-07-20 ENCOUNTER — Telehealth: Payer: Self-pay

## 2020-07-20 DIAGNOSIS — I10 Essential (primary) hypertension: Secondary | ICD-10-CM

## 2020-07-20 DIAGNOSIS — R7303 Prediabetes: Secondary | ICD-10-CM

## 2020-07-20 DIAGNOSIS — Z79899 Other long term (current) drug therapy: Secondary | ICD-10-CM

## 2020-07-20 DIAGNOSIS — E785 Hyperlipidemia, unspecified: Secondary | ICD-10-CM

## 2020-07-20 NOTE — Telephone Encounter (Signed)
Patient has 6 month follow up on 11/18 and needing labs done 

## 2020-07-20 NOTE — Telephone Encounter (Signed)
Pt contacted and verbalized understanding.  

## 2020-07-20 NOTE — Telephone Encounter (Signed)
Lab orders placed. Tried calling; no answer

## 2020-07-20 NOTE — Telephone Encounter (Signed)
Lipid liver metabolic 7 A1c 

## 2020-07-20 NOTE — Telephone Encounter (Signed)
Last labs completed 02/29/20 Urine Micro, A1C, BMET, Hepatic and Lipid. Please advise. Thank you

## 2020-08-11 LAB — LIPID PANEL
Chol/HDL Ratio: 3.1 ratio (ref 0.0–4.4)
Cholesterol, Total: 147 mg/dL (ref 100–199)
HDL: 47 mg/dL
LDL Chol Calc (NIH): 75 mg/dL (ref 0–99)
Triglycerides: 147 mg/dL (ref 0–149)
VLDL Cholesterol Cal: 25 mg/dL (ref 5–40)

## 2020-08-11 LAB — BASIC METABOLIC PANEL
BUN/Creatinine Ratio: 12 (ref 9–23)
BUN: 12 mg/dL (ref 6–24)
CO2: 24 mmol/L (ref 20–29)
Calcium: 10 mg/dL (ref 8.7–10.2)
Chloride: 96 mmol/L (ref 96–106)
Creatinine, Ser: 0.97 mg/dL (ref 0.57–1.00)
GFR calc Af Amer: 76 mL/min/{1.73_m2} (ref >59)
GFR calc non Af Amer: 66 mL/min/{1.73_m2} (ref >59)
Glucose: 113 mg/dL — ABNORMAL HIGH (ref 65–99)
Potassium: 4.1 mmol/L (ref 3.5–5.2)
Sodium: 136 mmol/L (ref 134–144)

## 2020-08-11 LAB — HEMOGLOBIN A1C
Est. average glucose Bld gHb Est-mCnc: 108 mg/dL
Hgb A1c MFr Bld: 5.4 % (ref 4.8–5.6)

## 2020-08-11 LAB — HEPATIC FUNCTION PANEL
ALT: 33 IU/L — ABNORMAL HIGH (ref 0–32)
AST: 28 IU/L (ref 0–40)
Albumin: 4.4 g/dL (ref 3.8–4.9)
Alkaline Phosphatase: 44 IU/L (ref 44–121)
Bilirubin Total: 0.3 mg/dL (ref 0.0–1.2)
Bilirubin, Direct: 0.11 mg/dL (ref 0.00–0.40)
Total Protein: 7.3 g/dL (ref 6.0–8.5)

## 2020-08-16 ENCOUNTER — Ambulatory Visit: Payer: 59 | Admitting: Family Medicine

## 2020-08-16 ENCOUNTER — Other Ambulatory Visit: Payer: Self-pay

## 2020-08-16 ENCOUNTER — Encounter: Payer: Self-pay | Admitting: Family Medicine

## 2020-08-16 VITALS — BP 122/70 | HR 81 | Temp 98.2°F | Ht 60.0 in | Wt 183.0 lb

## 2020-08-16 DIAGNOSIS — E785 Hyperlipidemia, unspecified: Secondary | ICD-10-CM

## 2020-08-16 DIAGNOSIS — K219 Gastro-esophageal reflux disease without esophagitis: Secondary | ICD-10-CM

## 2020-08-16 DIAGNOSIS — R7303 Prediabetes: Secondary | ICD-10-CM

## 2020-08-16 DIAGNOSIS — M79604 Pain in right leg: Secondary | ICD-10-CM

## 2020-08-16 DIAGNOSIS — I10 Essential (primary) hypertension: Secondary | ICD-10-CM | POA: Diagnosis not present

## 2020-08-16 MED ORDER — SERTRALINE HCL 100 MG PO TABS
ORAL_TABLET | ORAL | 1 refills | Status: DC
Start: 2020-08-16 — End: 2021-03-09

## 2020-08-16 MED ORDER — ALPRAZOLAM 1 MG PO TABS
1.0000 mg | ORAL_TABLET | Freq: Every evening | ORAL | 5 refills | Status: DC | PRN
Start: 2020-08-16 — End: 2021-02-13

## 2020-08-16 MED ORDER — HYDROCHLOROTHIAZIDE 25 MG PO TABS: ORAL_TABLET | ORAL | 1 refills | Status: DC

## 2020-08-16 MED ORDER — OMEPRAZOLE 20 MG PO CPDR
20.0000 mg | DELAYED_RELEASE_CAPSULE | Freq: Every day | ORAL | 1 refills | Status: DC
Start: 2020-08-16 — End: 2021-02-13

## 2020-08-16 MED ORDER — METFORMIN HCL 500 MG PO TABS
ORAL_TABLET | ORAL | 1 refills | Status: DC
Start: 2020-08-16 — End: 2021-02-13

## 2020-08-16 MED ORDER — LISINOPRIL 10 MG PO TABS
ORAL_TABLET | ORAL | 1 refills | Status: DC
Start: 2020-08-16 — End: 2021-02-13

## 2020-08-16 MED ORDER — PRAVASTATIN SODIUM 40 MG PO TABS
40.0000 mg | ORAL_TABLET | Freq: Every day | ORAL | 1 refills | Status: DC
Start: 2020-08-16 — End: 2021-02-13

## 2020-08-16 NOTE — Progress Notes (Signed)
Subjective:    Patient ID: Courtney Hunter, female    DOB: 02/29/1966, 54 y.o.   MRN: [id number]  Diabetes She presents for her follow-up diabetic visit. She has type 2 diabetes mellitus. Pertinent negatives for hypoglycemia include no confusion or dizziness. Pertinent negatives for diabetes include no chest pain, no fatigue, no polydipsia, no polyphagia and no weakness. Current diabetic treatments: metformin.  Essential hypertension, benign  Gastroesophageal reflux disease without esophagitis  Hyperlipidemia, unspecified hyperlipidemia type  Prediabetes  Right leg pain - Plan: Ambulatory referral to Orthopedic Surgery, CANCELED: Ambulatory referral to Orthopedic Surgery   Pt states no concerns today.  Results for orders placed or performed in visit on 07/20/20  Lipid Profile  Result Value Ref Range   Cholesterol, Total 147 100 - 199 mg/dL   Triglycerides 409 0 - 149 mg/dL   HDL 47 >81 mg/dL   VLDL Cholesterol Cal 25 5 - 40 mg/dL   LDL Chol Calc (NIH) 75 0 - 99 mg/dL   Chol/HDL Ratio 3.1 0.0 - 4.4 ratio  Hepatic function panel  Result Value Ref Range   Total Protein 7.3 6.0 - 8.5 g/dL   Albumin 4.4 3.8 - 4.9 g/dL   Bilirubin Total 0.3 0.0 - 1.2 mg/dL   Bilirubin, Direct 1.91 0.00 - 0.40 mg/dL   Alkaline Phosphatase 44 44 - 121 IU/L   AST 28 0 - 40 IU/L   ALT 33 (H) 0 - 32 IU/L  Basic Metabolic Panel (BMET)  Result Value Ref Range   Glucose 113 (H) 65 - 99 mg/dL   BUN 12 6 - 24 mg/dL   Creatinine, Ser 4.78 0.57 - 1.00 mg/dL   GFR calc non Af Amer 66 >59 mL/min/1.73   GFR calc Af Amer 76 >59 mL/min/1.73   BUN/Creatinine Ratio 12 9 - 23   Sodium 136 134 - 144 mmol/L   Potassium 4.1 3.5 - 5.2 mmol/L   Chloride 96 96 - 106 mmol/L   CO2 24 20 - 29 mmol/L   Calcium 10.0 8.7 - 10.2 mg/dL  Hemoglobin G9F  Result Value Ref Range   Hgb A1c MFr Bld 5.4 4.8 - 5.6 %   Est. average glucose Bld gHb Est-mCnc 108 mg/dL     Review of Systems  Constitutional: Negative for  activity change, appetite change and fatigue.  HENT: Negative for congestion and rhinorrhea.   Respiratory: Negative for cough and shortness of breath.   Cardiovascular: Negative for chest pain and leg swelling.  Gastrointestinal: Negative for abdominal pain and diarrhea.  Endocrine: Negative for polydipsia and polyphagia.  Skin: Negative for color change.  Neurological: Negative for dizziness and weakness.  Psychiatric/Behavioral: Negative for behavioral problems and confusion.       Objective:   Physical Exam Vitals reviewed.  Constitutional:      General: She is not in acute distress. HENT:     Head: Normocephalic and atraumatic.  Eyes:     General:        Right eye: No discharge.        Left eye: No discharge.  Neck:     Trachea: No tracheal deviation.  Cardiovascular:     Rate and Rhythm: Normal rate and regular rhythm.     Heart sounds: Normal heart sounds. No murmur heard.   Pulmonary:     Effort: Pulmonary effort is normal. No respiratory distress.     Breath sounds: Normal breath sounds.  Lymphadenopathy:     Cervical: No cervical adenopathy.  Skin:  General: Skin is warm and dry.  Neurological:     Mental Status: She is alert.     Coordination: Coordination normal.  Psychiatric:        Behavior: Behavior normal.           Assessment & Plan:  1. Essential hypertension, benign Blood pressure good control continue current measures watch salt diet stay active  2. Gastroesophageal reflux disease without esophagitis Reflux good control with current measures watch diet  3. Hyperlipidemia, unspecified hyperlipidemia type Lipids recent improved continue current meds  4. Prediabetes Prediabetes under good control  5. Right leg pain Ongoing right leg pain could be impingement of the nerve in her back could be a knee related issue will refer to orthopedics - Ambulatory referral to Orthopedic Surgery  6. Morbid obesity (HCC) Morbid obesity associated  with prediabetes hypertension hyperlipidemia watch diet stay active try to lose weight Follow-up 6 months

## 2020-09-04 LAB — HM DIABETES EYE EXAM

## 2020-10-16 ENCOUNTER — Telehealth: Payer: Self-pay | Admitting: Family Medicine

## 2020-10-16 ENCOUNTER — Ambulatory Visit (INDEPENDENT_AMBULATORY_CARE_PROVIDER_SITE_OTHER): Payer: 59 | Admitting: Family Medicine

## 2020-10-16 ENCOUNTER — Encounter: Payer: Self-pay | Admitting: Family Medicine

## 2020-10-16 DIAGNOSIS — N3001 Acute cystitis with hematuria: Secondary | ICD-10-CM | POA: Diagnosis not present

## 2020-10-16 MED ORDER — NITROFURANTOIN MONOHYD MACRO 100 MG PO CAPS: 100.0000 mg | ORAL_CAPSULE | Freq: Two times a day (BID) | ORAL | 0 refills | Status: DC

## 2020-10-16 MED ORDER — PHENAZOPYRIDINE HCL 95 MG PO TABS: 95.0000 mg | ORAL_TABLET | Freq: Three times a day (TID) | ORAL | 0 refills | Status: DC | PRN

## 2020-10-16 NOTE — Telephone Encounter (Signed)
Ms. Courtney Hunter, Courtney Hunter are scheduled for a virtual visit with your provider today.    Just as we do with appointments in the office, we must obtain your consent to participate.  Your consent will be active for this visit and any virtual visit you may have with one of our providers in the next 365 days.    If you have a MyChart account, I can also send a copy of this consent to you electronically.  All virtual visits are billed to your insurance company just like a traditional visit in the office.  As this is a virtual visit, video technology does not allow for your provider to perform a traditional examination.  This may limit your provider's ability to fully assess your condition.  If your provider identifies any concerns that need to be evaluated in person or the need to arrange testing such as labs, EKG, etc, we will make arrangements to do so.    Although advances in technology are sophisticated, we cannot ensure that it will always work on either your end or our end.  If the connection with a video visit is poor, we may have to switch to a telephone visit.  With either a video or telephone visit, we are not always able to ensure that we have a secure connection.   I need to obtain your verbal consent now.   Are you willing to proceed with your visit today?   Courtney Hunter has provided verbal consent on 10/16/2020 for a virtual visit (video or telephone).   Marlowe Shores, LPN 4/81/8563  1:49 PM

## 2020-10-16 NOTE — Progress Notes (Signed)
Pt woke up this morning around 2:30 with signs of UTI. Pt having burning and urgency. Has taken AZO and that does help with burning.   Virtual Visit via Telephone Note  I connected with Courtney Hunter on 10/16/20 at  3:00 PM EST by telephone and verified that I am speaking with the correct person using two identifiers.  Location: Patient: home Provider: office   I discussed the limitations, risks, security and privacy concerns of performing an evaluation and management service by telephone and the availability of in person appointments. I also discussed with the patient that there may be a patient responsible charge related to this service. The patient expressed understanding and agreed to proceed.   History of Present Illness:    Observations/Objective:   Assessment and Plan:   Follow Up Instructions:    I discussed the assessment and treatment plan with the patient. The patient was provided an opportunity to ask questions and all were answered. The patient agreed with the plan and demonstrated an understanding of the instructions.   The patient was advised to call back or seek an in-person evaluation if the symptoms worsen or if the condition fails to improve as anticipated.  I provided 10 minutes of non-face-to-face time during this encounter.    Patient ID: Courtney Hunter, female    DOB: Apr 16, 1966, 55 y.o.   MRN: [id number]   Chief Complaint  Patient presents with  . Urinary Tract Infection   Subjective:  CC: urinary burning and urgency   This is a new problem.  Reports today via telephone visit with a complaint of a possible UTI.  Symptoms started at 230 this morning with burning and urgency.  She does have diabetes, has not checked her blood sugar today, does not feel this is related to her diabetes.  She denies fever, chills, chest pain, shortness of breath.     Medical History Di has a past medical history of Allergy, Diabetes mellitus without  complication (HCC), Hyperlipidemia, Hypertension, ICS (immotile cilia syndrome), and Reactive airway disease.   Outpatient Encounter Medications as of 10/16/2020  Medication Sig  . albuterol (VENTOLIN HFA) 108 (90 Base) MCG/ACT inhaler USE 2 PUFFS INTO LUNGS EVERY 4 HOURS AS NEEDED FOR WHEEZING OR SHORTNESS OF BREATH  . ALPRAZolam (XANAX) 1 MG tablet Take 1 tablet (1 mg total) by mouth at bedtime as needed. for sleep  . aspirin 81 MG tablet Take 81 mg by mouth daily.  . Cyanocobalamin (VITAMIN B 12 PO) Take by mouth.  . fish oil-omega-3 fatty acids 1000 MG capsule Take 2 g by mouth daily.  Marland Kitchen glucosamine-chondroitin 500-400 MG tablet Take 1 tablet by mouth 3 (three) times daily.  . hydrochlorothiazide (HYDRODIURIL) 25 MG tablet TAKE ONE (1) TABLET BY MOUTH EVERY DAY  . lisinopril (ZESTRIL) 10 MG tablet TAKE ONE (1) TABLET BY MOUTH EVERY DAY  . metFORMIN (GLUCOPHAGE) 500 MG tablet One pill  a day, with meal  . nitrofurantoin, macrocrystal-monohydrate, (MACROBID) 100 MG capsule Take 1 capsule (100 mg total) by mouth 2 (two) times daily.  Marland Kitchen omeprazole (PRILOSEC) 20 MG capsule Take 1 capsule (20 mg total) by mouth daily.  . phenazopyridine (PYRIDIUM) 95 MG tablet Take 1 tablet (95 mg total) by mouth 3 (three) times daily as needed for pain.  . pravastatin (PRAVACHOL) 40 MG tablet Take 1 tablet (40 mg total) by mouth daily.  . sertraline (ZOLOFT) 100 MG tablet 1.5 tablets qd   No facility-administered encounter medications on file as of  10/16/2020.     Review of Systems  Constitutional: Negative for chills and fever.  HENT: Negative for congestion.   Respiratory: Negative for cough and shortness of breath.   Cardiovascular: Negative for chest pain.  Gastrointestinal: Negative for abdominal pain, nausea and vomiting.  Genitourinary: Positive for decreased urine volume, dysuria, frequency and urgency.     Vitals There were no vitals taken for this visit. Does not have a pulse ox  meter Objective:   Physical Exam  Unable, reports she does have some left flank tenderness. Assessment and Plan   1. Acute cystitis with hematuria - phenazopyridine (PYRIDIUM) 95 MG tablet; Take 1 tablet (95 mg total) by mouth 3 (three) times daily as needed for pain.  Dispense: 10 tablet; Refill: 0 - nitrofurantoin, macrocrystal-monohydrate, (MACROBID) 100 MG capsule; Take 1 capsule (100 mg total) by mouth 2 (two) times daily.  Dispense: 20 capsule; Refill: 0   Due to bad weather, unable to be seen in the office today.  Will treat for urinary tract infection with antibiotics and Pyridium for pain.  She understands if her symptoms do not improve, she will need to make an in person visit so that we can check her urine, and send it for culture.  Agrees with plan of care discussed today. Understands warning signs to seek further care: chest pain, shortness of breath, any significant change in health.  Warnings given about pyelonephritis, she understands that this infection is very serious. Understands to follow-up if symptoms do not improve, or worsen, will make an in person visit so that we can check urine, send for culture.  Recommend supportive therapy, adequate hydration and to keep an eye on blood sugars.   Dorena Bodo, FNP-C 10/16/2020

## 2020-10-19 ENCOUNTER — Other Ambulatory Visit: Payer: Self-pay | Admitting: *Deleted

## 2020-10-19 DIAGNOSIS — Z1211 Encounter for screening for malignant neoplasm of colon: Secondary | ICD-10-CM

## 2020-10-24 ENCOUNTER — Encounter: Payer: Self-pay | Admitting: Internal Medicine

## 2020-11-01 ENCOUNTER — Other Ambulatory Visit: Payer: Self-pay | Admitting: Orthopedic Surgery

## 2020-11-01 DIAGNOSIS — M545 Low back pain, unspecified: Secondary | ICD-10-CM

## 2020-11-02 ENCOUNTER — Telehealth: Payer: Self-pay

## 2020-11-02 NOTE — Telephone Encounter (Signed)
Phone call to patient to verify medication list and allergies for myelogram procedure. Pt instructed to hold Zoloft for 48hrs prior to myelogram appointment time and 24 hours after appointment. Pt also instructed to have a driver the day of the procedure, the procedure would take around 2 hours, and discharge instructions discussed. Pt verbalized understanding.   

## 2020-12-06 ENCOUNTER — Ambulatory Visit: Payer: 59 | Admitting: Nurse Practitioner

## 2021-01-18 ENCOUNTER — Telehealth: Payer: Self-pay | Admitting: Family Medicine

## 2021-01-18 DIAGNOSIS — Z79899 Other long term (current) drug therapy: Secondary | ICD-10-CM

## 2021-01-18 DIAGNOSIS — R5383 Other fatigue: Secondary | ICD-10-CM

## 2021-01-18 DIAGNOSIS — E785 Hyperlipidemia, unspecified: Secondary | ICD-10-CM

## 2021-01-18 DIAGNOSIS — R7303 Prediabetes: Secondary | ICD-10-CM

## 2021-01-18 DIAGNOSIS — I10 Essential (primary) hypertension: Secondary | ICD-10-CM

## 2021-01-18 NOTE — Telephone Encounter (Signed)
Patient wanting to add to her blood work to check her thyroid. Please advise

## 2021-01-18 NOTE — Telephone Encounter (Signed)
Last labs 07/2020: Lipid, liver, met 7, HgbA1c-  Patient also wants thyroid checked

## 2021-01-20 NOTE — Telephone Encounter (Signed)
A1c, CMP, TSH, urine ACR, lipid Hyperlipidemia, hypertension, diabetes, other fatigue, elevated liver enzyme

## 2021-01-21 NOTE — Telephone Encounter (Signed)
Blood work ordered in Epic. Left message to return call 

## 2021-01-25 NOTE — Telephone Encounter (Signed)
Mychart message sent to patient.

## 2021-01-28 NOTE — Telephone Encounter (Signed)
Left detailed message on voicemail that labs were ordered.

## 2021-02-12 LAB — LIPID PANEL
Chol/HDL Ratio: 3.3 ratio (ref 0.0–4.4)
Cholesterol, Total: 163 mg/dL (ref 100–199)
HDL: 49 mg/dL (ref >39)
LDL Chol Calc (NIH): 84 mg/dL (ref 0–99)
Triglycerides: 179 mg/dL — ABNORMAL HIGH (ref 0–149)
VLDL Cholesterol Cal: 30 mg/dL (ref 5–40)

## 2021-02-12 LAB — COMPREHENSIVE METABOLIC PANEL
ALT: 21 IU/L (ref 0–32)
AST: 19 IU/L (ref 0–40)
Albumin/Globulin Ratio: 1.7 (ref 1.2–2.2)
Albumin: 4.3 g/dL (ref 3.8–4.9)
Alkaline Phosphatase: 51 IU/L (ref 44–121)
BUN/Creatinine Ratio: 19 (ref 9–23)
BUN: 17 mg/dL (ref 6–24)
Bilirubin Total: 0.2 mg/dL (ref 0.0–1.2)
CO2: 24 mmol/L (ref 20–29)
Calcium: 9.8 mg/dL (ref 8.7–10.2)
Chloride: 100 mmol/L (ref 96–106)
Creatinine, Ser: 0.89 mg/dL (ref 0.57–1.00)
Globulin, Total: 2.6 g/dL (ref 1.5–4.5)
Glucose: 128 mg/dL — ABNORMAL HIGH (ref 65–99)
Potassium: 3.8 mmol/L (ref 3.5–5.2)
Sodium: 136 mmol/L (ref 134–144)
Total Protein: 6.9 g/dL (ref 6.0–8.5)
eGFR: 76 mL/min/{1.73_m2} (ref >59)

## 2021-02-12 LAB — MICROALBUMIN / CREATININE URINE RATIO
Creatinine, Urine: 104.8 mg/dL
Microalb/Creat Ratio: 7 mg/g creat (ref 0–29)
Microalbumin, Urine: 7.1 ug/mL

## 2021-02-12 LAB — HEMOGLOBIN A1C
Est. average glucose Bld gHb Est-mCnc: 134 mg/dL
Hgb A1c MFr Bld: 6.3 % — ABNORMAL HIGH (ref 4.8–5.6)

## 2021-02-12 LAB — TSH: TSH: 2.83 u[IU]/mL (ref 0.450–4.500)

## 2021-02-13 ENCOUNTER — Other Ambulatory Visit: Payer: Self-pay

## 2021-02-13 ENCOUNTER — Ambulatory Visit (INDEPENDENT_AMBULATORY_CARE_PROVIDER_SITE_OTHER): Payer: 59 | Admitting: Family Medicine

## 2021-02-13 VITALS — BP 122/82 | HR 67 | Temp 97.5°F | Ht 60.0 in | Wt 189.0 lb

## 2021-02-13 DIAGNOSIS — I10 Essential (primary) hypertension: Secondary | ICD-10-CM

## 2021-02-13 DIAGNOSIS — G4700 Insomnia, unspecified: Secondary | ICD-10-CM | POA: Diagnosis not present

## 2021-02-13 DIAGNOSIS — R7303 Prediabetes: Secondary | ICD-10-CM

## 2021-02-13 DIAGNOSIS — E785 Hyperlipidemia, unspecified: Secondary | ICD-10-CM | POA: Diagnosis not present

## 2021-02-13 MED ORDER — OMEPRAZOLE 20 MG PO CPDR: 20.0000 mg | DELAYED_RELEASE_CAPSULE | Freq: Every day | ORAL | 1 refills | Status: DC

## 2021-02-13 MED ORDER — PRAVASTATIN SODIUM 40 MG PO TABS: 40.0000 mg | ORAL_TABLET | Freq: Every day | ORAL | 1 refills | Status: DC

## 2021-02-13 MED ORDER — HYDROCHLOROTHIAZIDE 25 MG PO TABS: ORAL_TABLET | ORAL | 1 refills | Status: DC

## 2021-02-13 MED ORDER — LISINOPRIL 10 MG PO TABS: ORAL_TABLET | ORAL | 1 refills | Status: DC

## 2021-02-13 MED ORDER — ALPRAZOLAM 1 MG PO TABS: 1.0000 mg | ORAL_TABLET | Freq: Every evening | ORAL | 5 refills | Status: DC | PRN

## 2021-02-13 MED ORDER — METFORMIN HCL 500 MG PO TABS: ORAL_TABLET | ORAL | 1 refills | Status: DC

## 2021-02-13 NOTE — Progress Notes (Signed)
Subjective:    Patient ID: Courtney Hunter, female    DOB: [birth year], 55 y.o.   MRN: [id number]  HPI  Hypertension, prediabetes follow up and medication check Patient states she has not been doing a good job watching how she eats recently Not exercising Recently got an electric bike will do some pedaling and exercise to try to improve her condition She did see her numbers and understands that she does not want this as a rising trend  Results for orders placed or performed in visit on 01/18/21  Hemoglobin A1c  Result Value Ref Range   Hgb A1c MFr Bld 6.3 (H) 4.8 - 5.6 %   Est. average glucose Bld gHb Est-mCnc 134 mg/dL  Comprehensive metabolic panel  Result Value Ref Range   Glucose 128 (H) 65 - 99 mg/dL   BUN 17 6 - 24 mg/dL   Creatinine, Ser 0.89 0.57 - 1.00 mg/dL   eGFR 76 >59 mL/min/1.73   BUN/Creatinine Ratio 19 9 - 23   Sodium 136 134 - 144 mmol/L   Potassium 3.8 3.5 - 5.2 mmol/L   Chloride 100 96 - 106 mmol/L   CO2 24 20 - 29 mmol/L   Calcium 9.8 8.7 - 10.2 mg/dL   Total Protein 6.9 6.0 - 8.5 g/dL   Albumin 4.3 3.8 - 4.9 g/dL   Globulin, Total 2.6 1.5 - 4.5 g/dL   Albumin/Globulin Ratio 1.7 1.2 - 2.2   Bilirubin Total 0.2 0.0 - 1.2 mg/dL   Alkaline Phosphatase 51 44 - 121 IU/L   AST 19 0 - 40 IU/L   ALT 21 0 - 32 IU/L  TSH  Result Value Ref Range   TSH 2.830 0.450 - 4.500 uIU/mL  Microalbumin / creatinine urine ratio  Result Value Ref Range   Creatinine, Urine 104.8 Not Estab. mg/dL   Microalbumin, Urine 7.1 Not Estab. ug/mL   Microalb/Creat Ratio 7 0 - 29 mg/g creat  Lipid panel  Result Value Ref Range   Cholesterol, Total 163 100 - 199 mg/dL   Triglycerides 179 (H) 0 - 149 mg/dL   HDL 49 >39 mg/dL   VLDL Cholesterol Cal 30 5 - 40 mg/dL   LDL Chol Calc (NIH) 84 0 - 99 mg/dL   Chol/HDL Ratio 3.3 0.0 - 4.4 ratio    Review of Systems     Objective:   Physical Exam Vitals reviewed.  Constitutional:      General: She is not in acute  distress. HENT:     Head: Normocephalic.  Cardiovascular:     Rate and Rhythm: Normal rate and regular rhythm.     Heart sounds: Normal heart sounds. No murmur heard.   Pulmonary:     Effort: Pulmonary effort is normal.     Breath sounds: Normal breath sounds.  Lymphadenopathy:     Cervical: No cervical adenopathy.  Neurological:     Mental Status: She is alert.  Psychiatric:        Behavior: Behavior normal.           Assessment & Plan:  1. Essential hypertension, benign HTN- patient seen for follow-up regarding HTN.  Diet, medication compliance, appropriate labs and refills were completed.  Importance of keeping blood pressure under good control to lessen the risk of complications discussed  - Hemoglobin A1c - Comprehensive metabolic panel - Lipid panel - Urine Microalbumin w/creat. ratio  2. Hyperlipidemia, unspecified hyperlipidemia type Hyperlipidemia-importance of diet, weight control, activity, compliance with medications discussed.  Recent labs  reviewed.  Any additional labs or refills ordered.  Importance of keeping under good control discussed.    The patient was seen today as part of a comprehensive visit for diabetes. The importance of keeping her A1c at or below 7 was discussed.  Discussed diet, activity, and medication compliance Standard follow-up visit recommended.  Patient aware lack of control and follow-up increases risk of diabetic complications.  - Hemoglobin A1c - Comprehensive metabolic panel - Lipid panel - Urine Microalbumin w/creat. ratio  3. Insomnia, unspecified type Does take her medication at nighttime - Hemoglobin A1c - Comprehensive metabolic panel - Lipid panel - Urine Microalbumin w/creat. ratio  4. Prediabetes Watches starches currently but over the past several months did not do a good job - Hemoglobin A1c - Comprehensive metabolic panel - Lipid panel - Urine Microalbumin w/creat. ratio  5. Morbid obesity ([place]) Has not  been exercising she knows she needs to get back and exercising watching portions she will follow-up again in several months

## 2021-03-08 ENCOUNTER — Other Ambulatory Visit: Payer: Self-pay | Admitting: Family Medicine

## 2021-07-12 ENCOUNTER — Ambulatory Visit: Payer: 59 | Admitting: Family Medicine

## 2021-07-12 ENCOUNTER — Other Ambulatory Visit: Payer: Self-pay

## 2021-07-12 ENCOUNTER — Encounter: Payer: Self-pay | Admitting: Family Medicine

## 2021-07-12 VITALS — BP 137/88 | HR 78 | Temp 97.2°F | Wt 192.0 lb

## 2021-07-12 DIAGNOSIS — I10 Essential (primary) hypertension: Secondary | ICD-10-CM

## 2021-07-12 DIAGNOSIS — R222 Localized swelling, mass and lump, trunk: Secondary | ICD-10-CM

## 2021-07-12 DIAGNOSIS — R7303 Prediabetes: Secondary | ICD-10-CM

## 2021-07-12 DIAGNOSIS — J3089 Other allergic rhinitis: Secondary | ICD-10-CM | POA: Diagnosis not present

## 2021-07-12 DIAGNOSIS — Z79899 Other long term (current) drug therapy: Secondary | ICD-10-CM

## 2021-07-12 DIAGNOSIS — R609 Edema, unspecified: Secondary | ICD-10-CM

## 2021-07-12 NOTE — Progress Notes (Addendum)
1  Subjective:    Patient ID: Courtney Hunter, female    DOB: 1966-05-08, 55 y.o.   MRN: [id number]  HPI Pt here due to puffy areas on left and right side of upper chest/neck area. Pt noticed right side on Saturday;felt like a raised area but it feels funny. Left side of neck a couple days ago. No pain Yesterday pt right eye was swollen, puffy and painful/sore.   She states she is trying to eat healthy.  Avoids excessive salt.  Has underlying history of prediabetes/diabetes significant obesity Review of Systems     Objective:   Physical Exam  Lungs clear heart regular extremities no edema puffiness around the face could be allergy related issues She also relates fullness in the supraclavicular regions bilateral.  I do not feel any masses but is hard to know if there could be something under this could also be body habitus     Assessment & Plan:  1. Non-seasonal allergic rhinitis due to other allergic trigger Allergy medicine sertraline regular basis  2. Supraclavicular fossa fullness It is hard to know if this is a true mass or if this is just her body habitus recommend ultrasound to begin with - US Soft Tissue Head/Neck (NON-THYROID)  3. Essential hypertension, benign Check A1c blood pressure good control continue current measures - Hemoglobin A1c - Basic Metabolic Panel (BMET) - Hepatic function panel  4. Prediabetes Check A1c try Ozempic 0.25 mg weekly if this tolerates well we will bump up to 0.5 mg patient is send [place] monthly MyChart messages follow-up in 3 months  Given her obesity along with prediabetes and in the past A1c in diabetic range concerning for progressive issues is very important for the patient to get A1c under control as well as get her weight under control she is trying hard with dietary measures exercise without much success - Hemoglobin A1c - Basic Metabolic Panel (BMET) - Hepatic function panel  5. Edema, unspecified type Minimal edema noted Ozempic  will help weight loss will help - Hemoglobin A1c - Basic Metabolic Panel (BMET) - Hepatic function panel  6. High risk medication use Labs ordered - Hemoglobin A1c - Basic Metabolic Panel (BMET) - Hepatic function panel

## 2021-07-12 NOTE — Patient Instructions (Signed)
Loratadine daily  We will look at your labs Possibly start Ozempic depending on labs

## 2021-07-13 LAB — HEMOGLOBIN A1C
Est. average glucose Bld gHb Est-mCnc: 137 mg/dL
Hgb A1c MFr Bld: 6.4 % — ABNORMAL HIGH (ref 4.8–5.6)

## 2021-07-13 LAB — BASIC METABOLIC PANEL
BUN/Creatinine Ratio: 13 (ref 9–23)
BUN: 11 mg/dL (ref 6–24)
CO2: 23 mmol/L (ref 20–29)
Calcium: 10.1 mg/dL (ref 8.7–10.2)
Chloride: 99 mmol/L (ref 96–106)
Creatinine, Ser: 0.86 mg/dL (ref 0.57–1.00)
Glucose: 120 mg/dL — ABNORMAL HIGH (ref 70–99)
Potassium: 3.5 mmol/L (ref 3.5–5.2)
Sodium: 140 mmol/L (ref 134–144)
eGFR: 79 mL/min/{1.73_m2} (ref >59)

## 2021-07-13 LAB — HEPATIC FUNCTION PANEL
ALT: 31 IU/L (ref 0–32)
AST: 30 IU/L (ref 0–40)
Albumin: 5 g/dL — ABNORMAL HIGH (ref 3.8–4.9)
Alkaline Phosphatase: 56 IU/L (ref 44–121)
Bilirubin Total: <0.2 mg/dL (ref 0.0–1.2)
Bilirubin, Direct: <0.1 mg/dL (ref 0.00–0.40)
Total Protein: 7.5 g/dL (ref 6.0–8.5)

## 2021-07-14 NOTE — Addendum Note (Signed)
Addended by: Lilyan Punt A on: 07/14/2021 09:06 AM   Modules accepted: Level of Service

## 2021-07-15 ENCOUNTER — Encounter: Payer: Self-pay | Admitting: Family Medicine

## 2021-07-15 ENCOUNTER — Telehealth: Payer: Self-pay | Admitting: Family Medicine

## 2021-07-15 ENCOUNTER — Other Ambulatory Visit: Payer: Self-pay | Admitting: Family Medicine

## 2021-07-15 DIAGNOSIS — I10 Essential (primary) hypertension: Secondary | ICD-10-CM

## 2021-07-15 DIAGNOSIS — R7303 Prediabetes: Secondary | ICD-10-CM

## 2021-07-15 MED ORDER — OZEMPIC (0.25 OR 0.5 MG/DOSE) 2 MG/1.5ML ~~LOC~~ SOPN: PEN_INJECTOR | SUBCUTANEOUS | 0 refills | Status: DC

## 2021-07-15 NOTE — Addendum Note (Signed)
Addended by: Marlowe Shores on: 07/15/2021 05:05 PM   Modules accepted: Orders

## 2021-07-15 NOTE — Telephone Encounter (Signed)
Med directions changed and sent to [facility]

## 2021-07-15 NOTE — Telephone Encounter (Signed)
May utilize Ozempic 0.5 mg weekly 30-day supply with 3 refills Pharmacist will tell the patient and start off at 0.25 for the first couple weeks till he adjusts to it and then from there they will utilize 0.5

## 2021-07-15 NOTE — Telephone Encounter (Signed)
Tammy from [facility] calling to see if we can send in Ozempic with directions 0.5 weekly. Tammy states she will tell the pt 0.25 weekly but in order for insurance to cover this; the directions need to be 0.5 weekly. Due to 0.25 weekly then 0.5 weekly as previously written is 6 week supply and if it is written 0.5 weekly it will be 30 days supply. Please advise. Thank you

## 2021-07-22 ENCOUNTER — Ambulatory Visit (HOSPITAL_COMMUNITY): Payer: 59

## 2021-08-01 ENCOUNTER — Ambulatory Visit (HOSPITAL_COMMUNITY): Payer: 59

## 2021-08-08 ENCOUNTER — Encounter: Payer: Self-pay | Admitting: Family Medicine

## 2021-08-25 NOTE — Telephone Encounter (Signed)
Nurses-I recommend sticking with 0.5 mg once weekly  Please send then 1 additional month with 1 additional refill  Please have area send [place] an update within a few weeks how things are going  If significant nausea vomiting abdominal pain stop medicine notify us

## 2021-08-26 ENCOUNTER — Other Ambulatory Visit: Payer: Self-pay | Admitting: Family Medicine

## 2021-08-26 ENCOUNTER — Encounter: Payer: Self-pay | Admitting: Family Medicine

## 2021-08-26 MED ORDER — OZEMPIC (0.25 OR 0.5 MG/DOSE) 2 MG/1.5ML ~~LOC~~ SOPN: PEN_INJECTOR | SUBCUTANEOUS | 1 refills | Status: DC

## 2021-08-29 ENCOUNTER — Other Ambulatory Visit: Payer: Self-pay | Admitting: Family Medicine

## 2021-09-09 ENCOUNTER — Other Ambulatory Visit: Payer: Self-pay | Admitting: Family Medicine

## 2021-09-10 LAB — HM MAMMOGRAPHY

## 2021-09-16 ENCOUNTER — Other Ambulatory Visit: Payer: Self-pay | Admitting: Family Medicine

## 2021-10-03 ENCOUNTER — Encounter: Payer: Self-pay | Admitting: Family Medicine

## 2021-10-11 ENCOUNTER — Telehealth: Payer: Self-pay | Admitting: Family Medicine

## 2021-10-11 DIAGNOSIS — I10 Essential (primary) hypertension: Secondary | ICD-10-CM

## 2021-10-11 DIAGNOSIS — E785 Hyperlipidemia, unspecified: Secondary | ICD-10-CM

## 2021-10-11 DIAGNOSIS — R7303 Prediabetes: Secondary | ICD-10-CM

## 2021-10-11 DIAGNOSIS — Z79899 Other long term (current) drug therapy: Secondary | ICD-10-CM

## 2021-10-11 NOTE — Telephone Encounter (Signed)
error 

## 2021-10-11 NOTE — Telephone Encounter (Signed)
Pt has upcoming appt and would like lab orders placed. Pt has active labs from 07/15/21 for BMET and A1C. Pt had Hepatic, BMET, A1C completed on 07/12/21. Please advise. Thank you.

## 2021-10-12 NOTE — Telephone Encounter (Signed)
Lipid, liver, metabolic 7, A1c, urine ACR °

## 2021-10-14 ENCOUNTER — Other Ambulatory Visit: Payer: Self-pay | Admitting: Family Medicine

## 2021-10-14 NOTE — Addendum Note (Signed)
Addended by: Dairl Ponder on: 10/14/2021 09:14 AM   Modules accepted: Orders

## 2021-10-14 NOTE — Telephone Encounter (Signed)
Blood work ordered in Epic. Left message to return call 

## 2021-10-14 NOTE — Telephone Encounter (Signed)
Patient returned the call and was made aware of lab orders placed in epic.

## 2021-10-25 ENCOUNTER — Other Ambulatory Visit: Payer: Self-pay | Admitting: Family Medicine

## 2021-11-02 LAB — MICROALBUMIN / CREATININE URINE RATIO
Creatinine, Urine: 97.5 mg/dL
Microalb/Creat Ratio: 5 mg/g creat (ref 0–29)
Microalbumin, Urine: 5.1 ug/mL

## 2021-11-02 LAB — LIPID PANEL
Chol/HDL Ratio: 3.3 ratio (ref 0.0–4.4)
Cholesterol, Total: 156 mg/dL (ref 100–199)
HDL: 48 mg/dL (ref >39)
LDL Chol Calc (NIH): 82 mg/dL (ref 0–99)
Triglycerides: 153 mg/dL — ABNORMAL HIGH (ref 0–149)
VLDL Cholesterol Cal: 26 mg/dL (ref 5–40)

## 2021-11-02 LAB — BASIC METABOLIC PANEL
BUN/Creatinine Ratio: 16 (ref 9–23)
BUN: 14 mg/dL (ref 6–24)
CO2: 30 mmol/L — ABNORMAL HIGH (ref 20–29)
Calcium: 9.9 mg/dL (ref 8.7–10.2)
Chloride: 97 mmol/L (ref 96–106)
Creatinine, Ser: 0.85 mg/dL (ref 0.57–1.00)
Glucose: 105 mg/dL — ABNORMAL HIGH (ref 70–99)
Potassium: 3.6 mmol/L (ref 3.5–5.2)
Sodium: 140 mmol/L (ref 134–144)
eGFR: 80 mL/min/{1.73_m2} (ref >59)

## 2021-11-02 LAB — HEPATIC FUNCTION PANEL
ALT: 24 IU/L (ref 0–32)
AST: 25 IU/L (ref 0–40)
Albumin: 4.6 g/dL (ref 3.8–4.9)
Alkaline Phosphatase: 54 IU/L (ref 44–121)
Bilirubin Total: 0.3 mg/dL (ref 0.0–1.2)
Bilirubin, Direct: 0.13 mg/dL (ref 0.00–0.40)
Total Protein: 7.2 g/dL (ref 6.0–8.5)

## 2021-11-02 LAB — HEMOGLOBIN A1C
Est. average glucose Bld gHb Est-mCnc: 120 mg/dL
Hgb A1c MFr Bld: 5.8 % — ABNORMAL HIGH (ref 4.8–5.6)

## 2021-11-04 ENCOUNTER — Other Ambulatory Visit: Payer: Self-pay

## 2021-11-04 ENCOUNTER — Ambulatory Visit: Payer: 59 | Admitting: Family Medicine

## 2021-11-04 ENCOUNTER — Encounter: Payer: Self-pay | Admitting: Family Medicine

## 2021-11-04 VITALS — BP 116/80 | HR 84 | Temp 97.1°F | Ht 60.0 in | Wt 186.0 lb

## 2021-11-04 DIAGNOSIS — E785 Hyperlipidemia, unspecified: Secondary | ICD-10-CM

## 2021-11-04 DIAGNOSIS — I10 Essential (primary) hypertension: Secondary | ICD-10-CM

## 2021-11-04 MED ORDER — HYDROCHLOROTHIAZIDE 25 MG PO TABS: ORAL_TABLET | ORAL | 1 refills | Status: DC

## 2021-11-04 MED ORDER — ROSUVASTATIN CALCIUM 20 MG PO TABS: 20.0000 mg | ORAL_TABLET | Freq: Every day | ORAL | 1 refills | Status: DC

## 2021-11-04 MED ORDER — METFORMIN HCL 500 MG PO TABS: ORAL_TABLET | ORAL | 1 refills | Status: DC

## 2021-11-04 MED ORDER — OZEMPIC (0.25 OR 0.5 MG/DOSE) 2 MG/1.5ML ~~LOC~~ SOPN
PEN_INJECTOR | SUBCUTANEOUS | 1 refills | Status: DC
Start: 2021-11-04 — End: 2022-03-07

## 2021-11-04 MED ORDER — LISINOPRIL 10 MG PO TABS: ORAL_TABLET | ORAL | 1 refills | Status: DC

## 2021-11-04 MED ORDER — ALPRAZOLAM 1 MG PO TABS
ORAL_TABLET | ORAL | 5 refills | Status: DC
Start: 2021-11-04 — End: 2022-05-05

## 2021-11-04 NOTE — Progress Notes (Signed)
° °  Subjective:    Patient ID: Courtney Hunter, female    DOB: [birth year], 56 y.o.   MRN: [id number]  Hypertension This is a chronic problem. The problem is controlled. Treatments tried: lisionopril, hctz.   Prediabetes- metformin and semaglutide Saw nutritionist at [facility] She is following a food plan and ytrying to food prep  Patient with significant side effects related to the medication Not tolerating higher dose of Ozempic Having nausea and vomiting States she is trying to watch her diet to some degree trying to fit in some exercise Stress levels overall are reasonable Medications reviewed    Review of Systems     Objective:   Physical Exam General-in no acute distress Eyes-no discharge Lungs-respiratory rate normal, CTA CV-no murmurs,RRR Extremities skin warm dry no edema Neuro grossly normal Behavior normal, alert        Assessment & Plan:  Insomnia utilizes Xanax at nighttime to help her rest Cholesterol takes her medication watches diet lab work look good Blood pressure overall looks good continue medication Diabetes A1c decent control not tolerating the higher dose of Ozempic retry it There was some shortages issues In addition to this she will try to ramp up the dose if she has significant side effects we will stop this medicine She will send [place] update within a few weeks time

## 2021-11-21 ENCOUNTER — Other Ambulatory Visit: Payer: Self-pay | Admitting: Family Medicine

## 2021-12-04 LAB — HM DIABETES EYE EXAM

## 2022-02-26 ENCOUNTER — Other Ambulatory Visit: Payer: Self-pay | Admitting: Family Medicine

## 2022-03-07 ENCOUNTER — Ambulatory Visit: Payer: 59 | Admitting: Family Medicine

## 2022-03-07 DIAGNOSIS — R0781 Pleurodynia: Secondary | ICD-10-CM | POA: Diagnosis not present

## 2022-03-07 DIAGNOSIS — H9319 Tinnitus, unspecified ear: Secondary | ICD-10-CM | POA: Insufficient documentation

## 2022-03-07 DIAGNOSIS — H9311 Tinnitus, right ear: Secondary | ICD-10-CM

## 2022-03-07 MED ORDER — TRAMADOL HCL 50 MG PO TABS: 50.0000 mg | ORAL_TABLET | Freq: Three times a day (TID) | ORAL | 0 refills | Status: DC | PRN

## 2022-03-07 NOTE — Progress Notes (Signed)
Subjective:  Patient ID: Courtney Hunter, female    DOB: 09-16-1966  Age: 56 y.o. MRN: [id number]  CC: Chief Complaint  Patient presents with   right side rib pain     Pain w/ sittng , laying on right side x 1 week    ear sound    Swishing sound and lump behind R ear     HPI:  56 year old female presents for evaluation of the above.  Patient reports that she has had some pain of the right lower ribs since Saturday.  The only thing she can think of that may have caused this was leaning over to reach the bottom of her washer machine.  No fall that she is aware of.  Patient also reports that she has had some pain behind her right ear and also some swishing in her ear.  She would like me to examine this today.  No other reported symptoms.  No other complaints.  Patient Active Problem List   Diagnosis Date Noted   Tinnitus 03/07/2022   Rib pain on right side 03/07/2022   Acute cystitis with hematuria 10/16/2020   Morbid obesity (HCC) 09/02/2016   Prediabetes 08/18/2013   Hyperlipidemia 02/01/2013   Essential hypertension, benign 02/01/2013   Allergic rhinitis 02/01/2013   GERD (gastroesophageal reflux disease) 02/01/2013   Insomnia 02/01/2013   ASTHMA 08/15/2009    Social Hx   Social History   Socioeconomic History   Marital status: Married    Spouse name: Not on file   Number of children: Not on file   Years of education: Not on file   Highest education level: Not on file  Occupational History   Not on file  Tobacco Use   Smoking status: Never   Smokeless tobacco: Never  Vaping Use   Vaping Use: Never used  Substance and Sexual Activity   Alcohol use: Never   Drug use: Never   Sexual activity: Not on file  Other Topics Concern   Not on file  Social History Narrative   Not on file   Social Determinants of Health   Financial Resource Strain: Not on file  Food Insecurity: Not on file  Transportation Needs: Not on file  Physical Activity: Not on file   Stress: Not on file  Social Connections: Not on file    Review of Systems Per HPI  Objective:  BP 126/82   Pulse 60   Temp 98.2 F (36.8 C)   Ht 5' (1.524 m)   Wt 169 lb (76.7 kg)   SpO2 99%   BMI 33.01 kg/m      03/07/2022   11:07 AM 11/04/2021    8:50 AM 07/12/2021    2:03 PM  BP/Weight  Systolic BP 126 116 137  Diastolic BP 82 80 88  Wt. (Lbs) 169 186 192  BMI 33.01 kg/m2 36.33 kg/m2 37.5 kg/m2    Physical Exam Constitutional:      General: She is not in acute distress.    Appearance: Normal appearance. She is not ill-appearing.  HENT:     Head: Normocephalic and atraumatic.     Right Ear: Tympanic membrane and external ear normal. There is no impacted cerumen.     Left Ear: Tympanic membrane and external ear normal. There is no impacted cerumen.  Cardiovascular:     Rate and Rhythm: Normal rate and regular rhythm.  Pulmonary:     Effort: Pulmonary effort is normal.     Breath sounds: Normal breath sounds.  No wheezing, rhonchi or rales.  Musculoskeletal:     Comments: Discrete area of the right lower ribs that is tender to palpation.  Neurological:     Mental Status: She is alert.     Lab Results  Component Value Date   WBC 6.3 03/05/2016   HGB 13.7 03/05/2016   HCT 41.0 03/05/2016   PLT 358 03/05/2016   GLUCOSE 105 (H) 11/01/2021   CHOL 156 11/01/2021   TRIG 153 (H) 11/01/2021   HDL 48 11/01/2021   LDLCALC 82 11/01/2021   ALT 24 11/01/2021   AST 25 11/01/2021   NA 140 11/01/2021   K 3.6 11/01/2021   CL 97 11/01/2021   CREATININE 0.85 11/01/2021   BUN 14 11/01/2021   CO2 30 (H) 11/01/2021   TSH 2.830 02/11/2021   HGBA1C 5.8 (H) 11/01/2021   MICROALBUR 0.89 01/27/2014     Assessment & Plan:   Problem List Items Addressed This Visit       Other   Rib pain on right side    Rest.  Heat.  Tramadol if needed.      Tinnitus    Patient endorsing a swishing sound in the ear. Exam was unremarkable.  Supportive care.  Watchful waiting.        Meds ordered this encounter  Medications   traMADol (ULTRAM) 50 MG tablet    Sig: Take 1 tablet (50 mg total) by mouth every 8 (eight) hours as needed for moderate pain.    Dispense:  15 tablet    Refill:  0   Kedar Sedano DO [facility]

## 2022-03-07 NOTE — Patient Instructions (Signed)
Rest. Heat.  Medication if needed.  Call or message with concerns.  Take care  Dr. Adriana Simas

## 2022-03-07 NOTE — Assessment & Plan Note (Signed)
Patient endorsing a swishing sound in the ear. Exam was unremarkable.  Supportive care.  Watchful waiting.

## 2022-03-07 NOTE — Assessment & Plan Note (Signed)
Rest.  Heat.  Tramadol if needed.

## 2022-04-23 ENCOUNTER — Encounter: Payer: Self-pay | Admitting: Family Medicine

## 2022-04-23 DIAGNOSIS — E785 Hyperlipidemia, unspecified: Secondary | ICD-10-CM

## 2022-04-23 DIAGNOSIS — I10 Essential (primary) hypertension: Secondary | ICD-10-CM

## 2022-04-23 DIAGNOSIS — Z79899 Other long term (current) drug therapy: Secondary | ICD-10-CM

## 2022-04-23 DIAGNOSIS — R7303 Prediabetes: Secondary | ICD-10-CM

## 2022-04-23 NOTE — Telephone Encounter (Signed)
Nurses Please order lipid, liver, metabolic 7, A1c Diabetes hyperlipidemia  Also you may forward the following message to [facility] you have a nice vacation.  We look forward to seeing you. Also our records indicate that you are due for a mammogram.  More than likely you have already had your mammogram but we just do not have the record for.  If you have had your mammogram please ask that this provider forwards [place] a copy so we can properly note in your record that you have had this completed (This helps US show that we are properly keeping up with this important test)  If you have not had your mammogram we will discuss it further when you follow-up  TakeCare-Dr. Lorin Picket

## 2022-04-29 LAB — HEPATIC FUNCTION PANEL
ALT: 20 IU/L (ref 0–32)
AST: 21 IU/L (ref 0–40)
Albumin: 4.6 g/dL (ref 3.8–4.9)
Alkaline Phosphatase: 46 IU/L (ref 44–121)
Bilirubin Total: 0.3 mg/dL (ref 0.0–1.2)
Bilirubin, Direct: 0.13 mg/dL (ref 0.00–0.40)
Total Protein: 7.1 g/dL (ref 6.0–8.5)

## 2022-04-29 LAB — BASIC METABOLIC PANEL
BUN/Creatinine Ratio: 14 (ref 9–23)
BUN: 13 mg/dL (ref 6–24)
CO2: 25 mmol/L (ref 20–29)
Calcium: 9.7 mg/dL (ref 8.7–10.2)
Chloride: 98 mmol/L (ref 96–106)
Creatinine, Ser: 0.95 mg/dL (ref 0.57–1.00)
Glucose: 103 mg/dL — ABNORMAL HIGH (ref 70–99)
Potassium: 3.9 mmol/L (ref 3.5–5.2)
Sodium: 137 mmol/L (ref 134–144)
eGFR: 70 mL/min/{1.73_m2} (ref >59)

## 2022-04-29 LAB — HEMOGLOBIN A1C
Est. average glucose Bld gHb Est-mCnc: 108 mg/dL
Hgb A1c MFr Bld: 5.4 % (ref 4.8–5.6)

## 2022-04-29 LAB — LIPID PANEL
Chol/HDL Ratio: 2.8 ratio (ref 0.0–4.4)
Cholesterol, Total: 143 mg/dL (ref 100–199)
HDL: 51 mg/dL (ref >39)
LDL Chol Calc (NIH): 70 mg/dL (ref 0–99)
Triglycerides: 121 mg/dL (ref 0–149)
VLDL Cholesterol Cal: 22 mg/dL (ref 5–40)

## 2022-05-05 ENCOUNTER — Ambulatory Visit: Payer: 59 | Admitting: Family Medicine

## 2022-05-05 ENCOUNTER — Encounter: Payer: Self-pay | Admitting: Family Medicine

## 2022-05-05 VITALS — BP 122/70 | HR 66 | Temp 98.2°F | Ht 60.0 in | Wt 165.0 lb

## 2022-05-05 DIAGNOSIS — E785 Hyperlipidemia, unspecified: Secondary | ICD-10-CM

## 2022-05-05 DIAGNOSIS — G4700 Insomnia, unspecified: Secondary | ICD-10-CM

## 2022-05-05 DIAGNOSIS — I10 Essential (primary) hypertension: Secondary | ICD-10-CM | POA: Diagnosis not present

## 2022-05-05 MED ORDER — OMEPRAZOLE 20 MG PO CPDR: DELAYED_RELEASE_CAPSULE | ORAL | 1 refills | Status: DC

## 2022-05-05 MED ORDER — HYDROCHLOROTHIAZIDE 12.5 MG PO TABS
ORAL_TABLET | ORAL | 1 refills | Status: DC
Start: 2022-05-05 — End: 2022-11-05

## 2022-05-05 MED ORDER — ALPRAZOLAM 1 MG PO TABS: ORAL_TABLET | ORAL | 5 refills | Status: DC

## 2022-05-05 MED ORDER — LISINOPRIL 10 MG PO TABS: ORAL_TABLET | ORAL | 1 refills | Status: DC

## 2022-05-05 MED ORDER — METFORMIN HCL 500 MG PO TABS: ORAL_TABLET | ORAL | 1 refills | Status: DC

## 2022-05-05 MED ORDER — ROSUVASTATIN CALCIUM 20 MG PO TABS: 20.0000 mg | ORAL_TABLET | Freq: Every day | ORAL | 1 refills | Status: DC

## 2022-05-05 NOTE — Progress Notes (Signed)
   Subjective:    Patient ID: SHAZIA MITCHENER, female    DOB: [birth year], 56 y.o.   MRN: [id number]  Hypertension This is a chronic problem. Risk factors: lisinopril, HCTZ.   6 month follow up chronic medical conditions She has been working on a diet recently watching her diet she is eating mainly proteins and grains with some fruits.  She tries to drink mainly water stays away from sugary drinks Results for orders placed or performed in visit on 04/23/22  Lipid panel  Result Value Ref Range   Cholesterol, Total 143 100 - 199 mg/dL   Triglycerides 121 0 - 149 mg/dL   HDL 51 >39 mg/dL   VLDL Cholesterol Cal 22 5 - 40 mg/dL   LDL Chol Calc (NIH) 70 0 - 99 mg/dL   Chol/HDL Ratio 2.8 0.0 - 4.4 ratio  Hepatic function panel  Result Value Ref Range   Total Protein 7.1 6.0 - 8.5 g/dL   Albumin 4.6 3.8 - 4.9 g/dL   Bilirubin Total 0.3 0.0 - 1.2 mg/dL   Bilirubin, Direct 0.13 0.00 - 0.40 mg/dL   Alkaline Phosphatase 46 44 - 121 IU/L   AST 21 0 - 40 IU/L   ALT 20 0 - 32 IU/L  Basic metabolic panel  Result Value Ref Range   Glucose 103 (H) 70 - 99 mg/dL   BUN 13 6 - 24 mg/dL   Creatinine, Ser 0.95 0.57 - 1.00 mg/dL   eGFR 70 >59 mL/min/1.73   BUN/Creatinine Ratio 14 9 - 23   Sodium 137 134 - 144 mmol/L   Potassium 3.9 3.5 - 5.2 mmol/L   Chloride 98 96 - 106 mmol/L   CO2 25 20 - 29 mmol/L   Calcium 9.7 8.7 - 10.2 mg/dL  Hemoglobin A1c  Result Value Ref Range   Hgb A1c MFr Bld 5.4 4.8 - 5.6 %   Est. average glucose Bld gHb Est-mCnc 108 mg/dL  Kidney functions look good A1c is normal now Fasting glucose minimally elevated Liver enzymes look good Cholesterol looks good  Review of Systems     Objective:   Physical Exam  General-in no acute distress Eyes-no discharge Lungs-respiratory rate normal, CTA CV-no murmurs,RRR Extremities skin warm dry no edema Neuro grossly normal Behavior normal, alert       Assessment & Plan:  1. Essential hypertension, benign Blood  pressure good control continue current measures She has done a great job of losing weight.  Reduce HCTZ to 12.5 potentially reduce medications even further when she follows up in 6 months 2. Hyperlipidemia, unspecified hyperlipidemia type Cholesterol control very good continue current measures  3. Insomnia, unspecified type Uses Xanax at nighttime to help sleep continue this  Diabetes in remission doing very well with dietary measures and metformin continue metformin for now  Labs reviewed in detail She has had significant weight loss related to healthy diet and fitting in regular walking this is reduced her weight by 25 pounds  Patient has diabetes in remission continue metformin currently

## 2022-06-20 ENCOUNTER — Encounter: Payer: Self-pay | Admitting: *Deleted

## 2022-08-07 LAB — HM PAP SMEAR: HM Pap smear: NEGATIVE

## 2022-09-08 ENCOUNTER — Other Ambulatory Visit: Payer: Self-pay | Admitting: Family Medicine

## 2022-10-14 LAB — HM MAMMOGRAPHY

## 2022-10-21 ENCOUNTER — Encounter: Payer: Self-pay | Admitting: Family Medicine

## 2022-10-21 NOTE — Telephone Encounter (Signed)
Lipid, liver, metabolic 7, urine ACR, T6Y HTN hyperlipidemia prediabetes

## 2022-10-22 ENCOUNTER — Other Ambulatory Visit: Payer: Self-pay

## 2022-10-22 DIAGNOSIS — I10 Essential (primary) hypertension: Secondary | ICD-10-CM

## 2022-10-22 DIAGNOSIS — Z79899 Other long term (current) drug therapy: Secondary | ICD-10-CM

## 2022-10-22 DIAGNOSIS — R7303 Prediabetes: Secondary | ICD-10-CM

## 2022-10-22 DIAGNOSIS — E785 Hyperlipidemia, unspecified: Secondary | ICD-10-CM

## 2022-11-03 ENCOUNTER — Other Ambulatory Visit: Payer: Self-pay | Admitting: Family Medicine

## 2022-11-05 ENCOUNTER — Encounter: Payer: Self-pay | Admitting: Family Medicine

## 2022-11-05 ENCOUNTER — Ambulatory Visit: Payer: 59 | Admitting: Family Medicine

## 2022-11-05 VITALS — BP 118/72 | Wt 166.6 lb

## 2022-11-05 DIAGNOSIS — G4700 Insomnia, unspecified: Secondary | ICD-10-CM

## 2022-11-05 DIAGNOSIS — K219 Gastro-esophageal reflux disease without esophagitis: Secondary | ICD-10-CM

## 2022-11-05 DIAGNOSIS — R7303 Prediabetes: Secondary | ICD-10-CM

## 2022-11-05 DIAGNOSIS — I10 Essential (primary) hypertension: Secondary | ICD-10-CM | POA: Diagnosis not present

## 2022-11-05 DIAGNOSIS — E785 Hyperlipidemia, unspecified: Secondary | ICD-10-CM

## 2022-11-05 MED ORDER — HYDROCHLOROTHIAZIDE 12.5 MG PO TABS: ORAL_TABLET | ORAL | 1 refills | Status: DC

## 2022-11-05 MED ORDER — ROSUVASTATIN CALCIUM 20 MG PO TABS: 20.0000 mg | ORAL_TABLET | Freq: Every day | ORAL | 1 refills | Status: DC

## 2022-11-05 MED ORDER — METFORMIN HCL 500 MG PO TABS: ORAL_TABLET | ORAL | 1 refills | Status: DC

## 2022-11-05 MED ORDER — OMEPRAZOLE 20 MG PO CPDR: DELAYED_RELEASE_CAPSULE | ORAL | 3 refills | Status: DC

## 2022-11-05 MED ORDER — LISINOPRIL 10 MG PO TABS: ORAL_TABLET | ORAL | 1 refills | Status: DC

## 2022-11-05 MED ORDER — ALPRAZOLAM 1 MG PO TABS: ORAL_TABLET | ORAL | 5 refills | Status: DC

## 2022-11-05 MED ORDER — SERTRALINE HCL 100 MG PO TABS: 150.0000 mg | ORAL_TABLET | Freq: Every day | ORAL | 1 refills | Status: DC

## 2022-11-05 NOTE — Progress Notes (Signed)
   Subjective:    Patient ID: Courtney Hunter, female    DOB: [birth year], 57 y.o.   MRN: [id number]  HPI Patient arrives today for 6 mouth follow up and medication check. Patient states she needs refills. Patient no concerns or issues today. Gastroesophageal reflux disease without esophagitis  Essential hypertension, benign  Hyperlipidemia, unspecified hyperlipidemia type  Insomnia, unspecified type  Prediabetes  She has not been able to do her labs as of yet but she states that she will She is trying to devote herself to better eating and fitting and more exercise since [place] She relates her weight has come down a couple pounds  Patient for blood pressure check up.  The patient does have hypertension.   Patient relates dietary measures try to minimize salt The importance of healthy diet and activity were discussed Patient relates compliance  Patient here for follow-up regarding cholesterol.    Patient relates taking medication on a regular basis Denies problems with medication Importance of dietary measures discussed Regular lab work regarding lipid and liver was checked and if needing additional labs was appropriately ordered   Review of Systems     Objective:   Physical Exam  General-in no acute distress Eyes-no discharge Lungs-respiratory rate normal, CTA CV-no murmurs,RRR Extremities skin warm dry no edema Neuro grossly normal Behavior normal, alert  Patient did have her mammogram as well as Pap smear at her gynecologist we will try to get these records sent to [place] Patient was encouraged to get shingles vaccine     Assessment & Plan:  1. Gastroesophageal reflux disease without esophagitis Reflux related issues does well with taking the PPI in the morning and the H2 blocker in the evening healthy diet recommended  2. Essential hypertension, benign Blood pressure good control continue current measures watch diet stay active  3. Hyperlipidemia,  unspecified hyperlipidemia type Cholesterol profile looks good continue current measures  4. Insomnia, unspecified type Uses Xanax at nighttime does not overuse  5. Prediabetes A1c under good control she will do lab work we will see how it is currently  Mild obesity patient getting back to her diet and exercise-she is trying to lose weight over the course of the next 6 months

## 2022-11-11 ENCOUNTER — Encounter: Payer: Self-pay | Admitting: *Deleted

## 2023-03-09 ENCOUNTER — Other Ambulatory Visit: Payer: Self-pay | Admitting: Family Medicine

## 2023-03-09 ENCOUNTER — Encounter: Payer: Self-pay | Admitting: Family Medicine

## 2023-03-09 MED ORDER — HYDROCHLOROTHIAZIDE 25 MG PO TABS: ORAL_TABLET | ORAL | 1 refills | Status: DC

## 2023-04-17 LAB — BASIC METABOLIC PANEL
BUN/Creatinine Ratio: 16 (ref 9–23)
BUN: 15 mg/dL (ref 6–24)
CO2: 26 mmol/L (ref 20–29)
Calcium: 9.6 mg/dL (ref 8.7–10.2)
Chloride: 100 mmol/L (ref 96–106)
Creatinine, Ser: 0.94 mg/dL (ref 0.57–1.00)
Glucose: 106 mg/dL — ABNORMAL HIGH (ref 70–99)
Potassium: 4.1 mmol/L (ref 3.5–5.2)
Sodium: 140 mmol/L (ref 134–144)
eGFR: 70 mL/min/{1.73_m2} (ref >59)

## 2023-04-17 LAB — HEPATIC FUNCTION PANEL
ALT: 16 IU/L (ref 0–32)
AST: 24 IU/L (ref 0–40)
Albumin: 4.4 g/dL (ref 3.8–4.9)
Alkaline Phosphatase: 53 IU/L (ref 44–121)
Bilirubin Total: 0.2 mg/dL (ref 0.0–1.2)
Bilirubin, Direct: <0.1 mg/dL (ref 0.00–0.40)
Total Protein: 7.1 g/dL (ref 6.0–8.5)

## 2023-04-17 LAB — LIPID PANEL
Chol/HDL Ratio: 2.4 ratio (ref 0.0–4.4)
Cholesterol, Total: 133 mg/dL (ref 100–199)
HDL: 56 mg/dL (ref >39)
LDL Chol Calc (NIH): 53 mg/dL (ref 0–99)
Triglycerides: 140 mg/dL (ref 0–149)
VLDL Cholesterol Cal: 24 mg/dL (ref 5–40)

## 2023-04-17 LAB — HEMOGLOBIN A1C
Est. average glucose Bld gHb Est-mCnc: 123 mg/dL
Hgb A1c MFr Bld: 5.9 % — ABNORMAL HIGH (ref 4.8–5.6)

## 2023-04-17 LAB — MICROALBUMIN / CREATININE URINE RATIO
Creatinine, Urine: 93.5 mg/dL
Microalb/Creat Ratio: 7 mg/g creat (ref 0–29)
Microalbumin, Urine: 6.8 ug/mL

## 2023-04-23 ENCOUNTER — Other Ambulatory Visit: Payer: Self-pay | Admitting: Family Medicine

## 2023-05-06 ENCOUNTER — Ambulatory Visit: Payer: 59 | Admitting: Family Medicine

## 2023-05-06 VITALS — BP 128/74 | HR 67 | Temp 98.1°F | Ht 60.0 in | Wt 172.0 lb

## 2023-05-06 DIAGNOSIS — E785 Hyperlipidemia, unspecified: Secondary | ICD-10-CM | POA: Diagnosis not present

## 2023-05-06 DIAGNOSIS — R55 Syncope and collapse: Secondary | ICD-10-CM

## 2023-05-06 DIAGNOSIS — I10 Essential (primary) hypertension: Secondary | ICD-10-CM | POA: Diagnosis not present

## 2023-05-06 DIAGNOSIS — D649 Anemia, unspecified: Secondary | ICD-10-CM

## 2023-05-06 DIAGNOSIS — R7303 Prediabetes: Secondary | ICD-10-CM | POA: Diagnosis not present

## 2023-05-06 DIAGNOSIS — M791 Myalgia, unspecified site: Secondary | ICD-10-CM

## 2023-05-06 MED ORDER — LISINOPRIL 10 MG PO TABS: ORAL_TABLET | ORAL | 1 refills | Status: DC

## 2023-05-06 MED ORDER — ALPRAZOLAM 1 MG PO TABS: ORAL_TABLET | ORAL | 5 refills | Status: DC

## 2023-05-06 MED ORDER — OMEPRAZOLE 20 MG PO CPDR: DELAYED_RELEASE_CAPSULE | ORAL | 3 refills | Status: DC

## 2023-05-06 MED ORDER — SERTRALINE HCL 100 MG PO TABS: 150.0000 mg | ORAL_TABLET | Freq: Every day | ORAL | 1 refills | Status: DC

## 2023-05-06 MED ORDER — METFORMIN HCL 500 MG PO TABS: ORAL_TABLET | ORAL | 1 refills | Status: DC

## 2023-05-06 NOTE — Progress Notes (Signed)
   Subjective:    Patient ID: Courtney Hunter, female    DOB: 02-14-1966, 57 y.o.   MRN: [id number]  Hypertension This is a chronic problem. Treatments tried: hctz, lisinopril.  Essential hypertension, benign - Plan: EKG 12-Lead  Hyperlipidemia, unspecified hyperlipidemia type - Plan: C-reactive protein, Sedimentation Rate, CK  Prediabetes  Syncope, unspecified syncope type - Plan: C-reactive protein, Sedimentation Rate  Anemia, unspecified type - Plan: CBC with Differential  Myalgia - Plan: CK  Patient for blood pressure check up.  The patient does have hypertension.   Patient relates dietary measures try to minimize salt The importance of healthy diet and activity were discussed Patient relates compliance Patient here for follow-up regarding cholesterol.    Patient relates taking medication on a regular basis Denies problems with medication Importance of dietary measures discussed Regular lab work regarding lipid and liver was checked and if needing additional labs was appropriately ordered Patient with prediabetes on metformin trying to watch her diet trying to fit in some walking  Patient gave blood back in April.  She did not feel well right afterwards laid on the couch for a few hours slept got up and passed out she thinks she was out of it only a couple minutes at the most then she states she went over to the refrigerator when she passed out again from there she dried a make a purposeful effort of eating and drinking and did not have any other spells She has never had this happen before and is not had any lightheadedness chest tightness pressure pain with activity  She also relates some intermittent leg aching and discomfort from her knees down to her feet more so when she is just sitting sometimes with activity denies back pain but does have some intermittent symptoms 6 month follow up no concerns voiced - refills needed   Review of Systems     Objective:   Physical  Exam General-in no acute distress Eyes-no discharge Lungs-respiratory rate normal, CTA CV-no murmurs,RRR Extremities skin warm dry no edema Neuro grossly normal Behavior normal, alert        Assessment & Plan:  EKG does not show any acute changes  1. Essential hypertension, benign Labs reviewed with patient.  EKG does not show any acute changes. - EKG 12-Lead  2. Hyperlipidemia, unspecified hyperlipidemia type Continue medication labs reviewed - C-reactive protein - Sedimentation Rate - CK  3. Prediabetes A1c decent control continue medication  4. Syncope, unspecified syncope type More than likely related to low blood pressure related to getting blood if she has a reoccurrence of this will need full cardiology workup - C-reactive protein - Sedimentation Rate  5. Anemia, unspecified type Check CBC and make sure that not dealing with anemia - CBC with Differential  6. Myalgia Check lab work.  Stretches recommended - CK  Insomnia-recommend for the patient to start cutting back on her Xanax to three fourths of a tablet each evening Ultimate goal is to get down to none

## 2023-05-20 LAB — CBC WITH DIFFERENTIAL/PLATELET
Eos: 2 %
Hematocrit: 34 % (ref 34.0–46.6)
Monocytes: 10 %

## 2023-05-20 LAB — C-REACTIVE PROTEIN: CRP: <1 mg/L (ref 0–10)

## 2023-05-21 ENCOUNTER — Encounter: Payer: Self-pay | Admitting: Family Medicine

## 2023-05-21 DIAGNOSIS — D649 Anemia, unspecified: Secondary | ICD-10-CM

## 2023-05-21 NOTE — Telephone Encounter (Signed)
Nurses-please see result note thank you please put in the orders for additional labs thank you

## 2023-05-27 ENCOUNTER — Other Ambulatory Visit: Payer: Self-pay

## 2023-05-27 DIAGNOSIS — D649 Anemia, unspecified: Secondary | ICD-10-CM

## 2023-05-27 LAB — IRON,TIBC AND FERRITIN PANEL
Ferritin: 10 ng/mL — ABNORMAL LOW (ref 15–150)
Iron Saturation: 8 % — CL (ref 15–55)
Iron: 34 ug/dL (ref 27–159)
Total Iron Binding Capacity: 420 ug/dL (ref 250–450)
UIBC: 386 ug/dL (ref 131–425)

## 2023-06-02 ENCOUNTER — Telehealth: Payer: Self-pay

## 2023-06-02 NOTE — Telephone Encounter (Signed)
Stool sample dropped off placed in clinical

## 2023-06-03 ENCOUNTER — Other Ambulatory Visit: Payer: Self-pay

## 2023-06-03 DIAGNOSIS — D649 Anemia, unspecified: Secondary | ICD-10-CM

## 2023-06-03 LAB — IFOBT (OCCULT BLOOD): IFOBT: POSITIVE

## 2023-06-05 NOTE — Progress Notes (Unsigned)
Results discussed with pt, advised per Dr Gerda Diss. Pt verbalized udnerstanding

## 2023-06-10 ENCOUNTER — Encounter: Payer: Self-pay | Admitting: Family Medicine

## 2023-07-09 ENCOUNTER — Ambulatory Visit
Admission: RE | Admit: 2023-07-09 | Discharge: 2023-07-09 | Disposition: A | Discharge status: Home / Self Care | Payer: 59 | Source: Ambulatory Visit | Attending: Nurse Practitioner | Admitting: Nurse Practitioner

## 2023-07-09 VITALS — BP 161/74 | HR 71 | Temp 98.5°F | Resp 16

## 2023-07-09 DIAGNOSIS — L03213 Periorbital cellulitis: Secondary | ICD-10-CM | POA: Diagnosis not present

## 2023-07-09 MED ORDER — AMOXICILLIN-POT CLAVULANATE 875-125 MG PO TABS: 1.0000 | ORAL_TABLET | Freq: Two times a day (BID) | ORAL | 0 refills | Status: DC

## 2023-07-09 MED ORDER — CLINDAMYCIN HCL 300 MG PO CAPS
300.0000 mg | ORAL_CAPSULE | Freq: Three times a day (TID) | ORAL | 0 refills | Status: AC
Start: 2023-07-09 — End: 2023-07-16

## 2023-07-09 NOTE — ED Provider Notes (Signed)
[facility]    CSN: [id number] Arrival date & time: 07/09/23  1049      History   Chief Complaint Chief Complaint  Patient presents with   Eye Problem    Eye infection. Possible stye under upper eyelid. Very painful and affecting vision. Eye almost swollen shut - Entered by patient    HPI Courtney Hunter is a 57 y.o. female.   Patient presents today with 2 day of left eye swelling, redness around the eye, and eye pain.  Denies recent trauma to the eye.  She endorses poor vision at baseline, not worsening in the past day or 2.  No contact lens use or eye drainage.  No fever, body aches or chills.  States the left side of the eye/faces feels a little bit numb/tingly.  No rash.  Reports a few months ago, she had a stye however this fully resolved.  She started using erythromycin ointment to the eye that she was prescribed for the stye without much improvement.  Reports the eyelid swelling has gotten worse since it started.    Past Medical History:  Diagnosis Date   Allergy    Diabetes mellitus without complication (HCC)    Type II   Hyperlipidemia    Hypertension    ICS (immotile cilia syndrome)    Reactive airway disease     Patient Active Problem List   Diagnosis Date Noted   Tinnitus 03/07/2022   Rib pain on right side 03/07/2022   Acute cystitis with hematuria 10/16/2020   Prediabetes 08/18/2013   Hyperlipidemia 02/01/2013   Essential hypertension, benign 02/01/2013   Allergic rhinitis 02/01/2013   GERD (gastroesophageal reflux disease) 02/01/2013   Insomnia 02/01/2013   ASTHMA 08/15/2009    Past Surgical History:  Procedure Laterality Date   ABLATION     APPENDECTOMY     CHOLECYSTECTOMY     DILATION AND CURETTAGE OF UTERUS      OB History   No obstetric history on file.      Home Medications    Prior to Admission medications   Medication Sig Start Date End Date Taking? Authorizing Provider  amoxicillin-clavulanate (AUGMENTIN) 875-125  MG tablet Take 1 tablet by mouth every 12 (twelve) hours. 07/09/23  Yes Valentino Nose, NP  clindamycin (CLEOCIN) 300 MG capsule Take 1 capsule (300 mg total) by mouth 3 (three) times daily for 7 days. 07/09/23 07/16/23 Yes Valentino Nose, NP  albuterol (VENTOLIN HFA) 108 (90 Base) MCG/ACT inhaler USE 2 PUFFS INTO LUNGS EVERY 4 HOURS AS NEEDED FOR WHEEZING OR SHORTNESS OF BREATH 03/25/18   Babs Sciara, MD  ALPRAZolam (XANAX) 1 MG tablet TAKE ONE TABLET (1 MG TOTAL) BY MOUTH ATBEDTIME AS NEEDED FOR SLEEP. 05/06/23   Babs Sciara, MD  Cyanocobalamin (VITAMIN B 12 PO) Take by mouth.    [provider]  fish oil-omega-3 fatty acids 1000 MG capsule Take 2 g by mouth daily.    [provider]  glucosamine-chondroitin 500-400 MG tablet Take 1 tablet by mouth 3 (three) times daily.    [provider]  hydrochlorothiazide (HYDRODIURIL) 25 MG tablet 1/2 to 1 qam as needed edema 03/09/23   Babs Sciara, MD  lisinopril (ZESTRIL) 10 MG tablet 1 qam 05/06/23   Luking, Jonna Coup, MD  metFORMIN (GLUCOPHAGE) 500 MG tablet TAKE ONE (1) TABLET BY MOUTH EVERY DAY WITH A MEAL 05/06/23   Babs Sciara, MD  omeprazole (PRILOSEC) 20 MG capsule 1 qd 05/06/23  Babs Sciara, MD  rosuvastatin (CRESTOR) 20 MG tablet TAKE ONE TABLET (20MG  TOTAL) BY MOUTH DAILY STOP PRAVASTATIN 04/24/23   Babs Sciara, MD  sertraline (ZOLOFT) 100 MG tablet Take 1.5 tablets (150 mg total) by mouth daily. 05/06/23   Babs Sciara, MD    Family History History reviewed. No pertinent family history.  Social History Social History   Tobacco Use   Smoking status: Never   Smokeless tobacco: Never  Vaping Use   Vaping status: Never Used  Substance Use Topics   Alcohol use: Never   Drug use: Never     Allergies   Elemental sulfur, Sulfa antibiotics, Voltaren [diclofenac sodium], and Ozempic (0.25 or 0.5 mg-dose) [semaglutide(0.25 or 0.5mg -dos)]   Review of Systems Review of Systems Per  HPI  Physical Exam Triage Vital Signs ED Triage Vitals [07/09/23 1055]  Encounter Vitals Group     BP (!) 161/74     Systolic BP Percentile      Diastolic BP Percentile      Pulse Rate 71     Resp 16     Temp 98.5 F (36.9 C)     Temp Source Oral     SpO2 98 %     Weight      Height      Head Circumference      Peak Flow      Pain Score 5     Pain Loc      Pain Education      Exclude from Growth Chart    No data found.  Updated Vital Signs BP (!) 161/74 (BP Location: Right Arm)   Pulse 71   Temp 98.5 F (36.9 C) (Oral)   Resp 16   SpO2 98%   Visual Acuity Right Eye Distance: 20/40 Left Eye Distance: 20/50 Bilateral Distance: 50/40  Right Eye Near:   Left Eye Near:    Bilateral Near:     Physical Exam Vitals and nursing note reviewed.  Constitutional:      General: She is not in acute distress.    Appearance: Normal appearance. She is not toxic-appearing.  HENT:     Head: Normocephalic and atraumatic.     Right Ear: External ear normal.     Left Ear: External ear normal.     Nose: Nose normal. No congestion or rhinorrhea.     Mouth/Throat:     Mouth: Mucous membranes are moist.     Pharynx: Oropharynx is clear.  Eyes:     General:        Right eye: No discharge.        Left eye: No discharge or hordeolum.     Extraocular Movements: Extraocular movements intact.     Right eye: Normal extraocular motion.     Left eye: Normal extraocular motion.     Conjunctiva/sclera:     Left eye: Left conjunctiva is not injected. No exudate or hemorrhage.    Pupils: Pupils are equal, round, and reactive to light.     Left eye: No fluorescein uptake.     Comments: Edema to the upper left lid with slight erythema; area is tender to touch.  See photograph below.  Skin:    General: Skin is warm.     Capillary Refill: Capillary refill takes less than 2 seconds.     Coloration: Skin is not jaundiced or pale.     Findings: Erythema present. No rash.  Neurological:      Mental Status:  She is alert and oriented to person, place, and time.        UC Treatments / Results  Labs (all labs ordered are listed, but only abnormal results are displayed) Labs Reviewed - No data to display  EKG   Radiology No results found.  Procedures Procedures (including critical care time)  Medications Ordered in UC Medications - No data to display  Initial Impression / Assessment and Plan / UC Course  I have reviewed the triage vital signs and the nursing notes.  Pertinent labs & imaging results that were available during my care of the patient were reviewed by me and considered in my medical decision making (see chart for details).   Patient is well-appearing, normotensive, afebrile, not tachycardic, not tachypneic, oxygenating well on room air.    1. Preseptal cellulitis No red flags Fluorescein stain did not show any uptake today; low suspicion for herpes zoster Treat with Augmentin and clindamycin  Strict ER precautions discussed Recommended close follow up here or with PCP in 24 hours to ensure improving, otherwise may need ER evaluation for CT scan or orbit  The patient was given the opportunity to ask questions.  All questions answered to their satisfaction.  The patient is in agreement to this plan.    Final Clinical Impressions(s) / UC Diagnoses   Final diagnoses:  Preseptal cellulitis     Discharge Instructions      Take the Augmentin and Clindamycin as prescribed to treat skin infection around the eye.  Please follow up with your PCP within 24 hours for recheck.  If symptoms worsen in the meantime, seek care in ER.    ED Prescriptions     Medication Sig Dispense Auth. Provider   clindamycin (CLEOCIN) 300 MG capsule Take 1 capsule (300 mg total) by mouth 3 (three) times daily for 7 days. 21 capsule Cathlean Marseilles A, NP   amoxicillin-clavulanate (AUGMENTIN) 875-125 MG tablet Take 1 tablet by mouth every 12 (twelve) hours. 14 tablet  Valentino Nose, NP      PDMP not reviewed this encounter.   Valentino Nose, NP 07/09/23 1415

## 2023-07-09 NOTE — Discharge Instructions (Signed)
Take the Augmentin and Clindamycin as prescribed to treat skin infection around the eye.  Please follow up with your PCP within 24 hours for recheck.  If symptoms worsen in the meantime, seek care in ER.

## 2023-07-09 NOTE — ED Triage Notes (Signed)
Pt c/o swelling and redness to the left , feels like something is stabbing her eye. Possible stye under upper eyelid. Pt states it is painful and affecting vision.

## 2023-07-10 ENCOUNTER — Ambulatory Visit: Payer: 59 | Admitting: Nurse Practitioner

## 2023-07-10 ENCOUNTER — Emergency Department (HOSPITAL_COMMUNITY)
Admission: EM | Admit: 2023-07-10 | Discharge: 2023-07-10 | Disposition: A | Discharge status: Home / Self Care | Payer: 59 | Attending: Emergency Medicine | Admitting: Emergency Medicine

## 2023-07-10 ENCOUNTER — Encounter: Payer: Self-pay | Admitting: Nurse Practitioner

## 2023-07-10 ENCOUNTER — Encounter (HOSPITAL_COMMUNITY): Payer: Self-pay

## 2023-07-10 ENCOUNTER — Emergency Department (HOSPITAL_COMMUNITY): Payer: 59

## 2023-07-10 ENCOUNTER — Other Ambulatory Visit: Payer: Self-pay

## 2023-07-10 VITALS — BP 147/83 | HR 67 | Temp 98.9°F | Ht 60.0 in | Wt 175.4 lb

## 2023-07-10 DIAGNOSIS — Z79899 Other long term (current) drug therapy: Secondary | ICD-10-CM | POA: Diagnosis not present

## 2023-07-10 DIAGNOSIS — H5789 Other specified disorders of eye and adnexa: Secondary | ICD-10-CM | POA: Diagnosis present

## 2023-07-10 DIAGNOSIS — I10 Essential (primary) hypertension: Secondary | ICD-10-CM | POA: Diagnosis not present

## 2023-07-10 DIAGNOSIS — L03213 Periorbital cellulitis: Secondary | ICD-10-CM

## 2023-07-10 DIAGNOSIS — Z7984 Long term (current) use of oral hypoglycemic drugs: Secondary | ICD-10-CM | POA: Insufficient documentation

## 2023-07-10 DIAGNOSIS — J45909 Unspecified asthma, uncomplicated: Secondary | ICD-10-CM | POA: Insufficient documentation

## 2023-07-10 LAB — BASIC METABOLIC PANEL
Anion gap: 8 (ref 5–15)
BUN: 15 mg/dL (ref 6–20)
CO2: 27 mmol/L (ref 22–32)
Calcium: 9.5 mg/dL (ref 8.9–10.3)
Chloride: 100 mmol/L (ref 98–111)
Creatinine, Ser: 0.84 mg/dL (ref 0.44–1.00)
GFR, Estimated: >60 mL/min (ref >60)
Glucose, Bld: 96 mg/dL (ref 70–99)
Potassium: 3.6 mmol/L (ref 3.5–5.1)
Sodium: 135 mmol/L (ref 135–145)

## 2023-07-10 LAB — CBC WITH DIFFERENTIAL/PLATELET
Abs Immature Granulocytes: 0.02 10*3/uL (ref 0.00–0.07)
Basophils Absolute: 0.1 10*3/uL (ref 0.0–0.1)
Basophils Relative: 1 %
Eosinophils Absolute: 0.2 10*3/uL (ref 0.0–0.5)
Eosinophils Relative: 3 %
HCT: 36.7 % (ref 36.0–46.0)
Hemoglobin: 11.3 g/dL — ABNORMAL LOW (ref 12.0–15.0)
Immature Granulocytes: 0 %
Lymphocytes Relative: 26 %
Lymphs Abs: 1.7 10*3/uL (ref 0.7–4.0)
MCH: 24.9 pg — ABNORMAL LOW (ref 26.0–34.0)
MCHC: 30.8 g/dL (ref 30.0–36.0)
MCV: 80.8 fL (ref 80.0–100.0)
Monocytes Absolute: 0.7 10*3/uL (ref 0.1–1.0)
Monocytes Relative: 10 %
Neutro Abs: 3.8 10*3/uL (ref 1.7–7.7)
Neutrophils Relative %: 60 %
Platelets: 277 10*3/uL (ref 150–400)
RBC: 4.54 MIL/uL (ref 3.87–5.11)
RDW: 17.7 % — ABNORMAL HIGH (ref 11.5–15.5)
WBC: 6.4 10*3/uL (ref 4.0–10.5)
nRBC: 0 % (ref 0.0–0.2)

## 2023-07-10 MED ORDER — FLUORESCEIN SODIUM 1 MG OP STRP
1.0000 | ORAL_STRIP | Freq: Once | OPHTHALMIC | Status: DC
  Filled 2023-07-10: qty 1

## 2023-07-10 MED ORDER — IOHEXOL 300 MG/ML  SOLN
75.0000 mL | Freq: Once | INTRAMUSCULAR | Status: AC | PRN
  Administered 2023-07-10: 75 mL via INTRAVENOUS

## 2023-07-10 MED ORDER — TETRACAINE HCL 0.5 % OP SOLN
2.0000 [drp] | Freq: Once | OPHTHALMIC | Status: DC
  Filled 2023-07-10: qty 4

## 2023-07-10 NOTE — ED Provider Notes (Incomplete)
[facility] Provider Note   CSN: [id number] Arrival date & time: 07/10/23  1204     History {Add pertinent medical, surgical, social history, OB history to HPI:1} Chief Complaint  Patient presents with  . Eye Problem    Courtney Hunter is a 57 y.o. female with a history including asthma, hyperlipidemia, hypertension and GERD, also prediabetes presenting for evaluation of persistent swelling around her left eye.  She was seen at a urgent care yesterday secondary to left eye swelling which actually started 3 days ago.  She woke with a purple appearance to her left upper lid and over the course of several days she developed increased swelling of the upper lid and lateral eye area.  She was seen at our urgent care yesterday and was placed on clindamycin and Augmentin, presumptive treatment for periorbital cellulitis.  She woke this morning again with increased swelling, saw her PCP who sent her here for CT imaging.  She denies significant pain but does have some discomfort secondary to the swelling, there is been tearing from the eye as well.  She denies foreign body sensation.  She has had no fevers or chills, she denies any visual changes.  She has had 3 doses of the clindamycin and 2 of the Augmentin.  She does wear eyeglasses, no contacts.  She reports having a stye in her medial lower eyelid internally about 6 months ago, she was placed on antibiotics for this including topical and it completely resolved.  The history is provided by the patient.       Home Medications Prior to Admission medications   Medication Sig Start Date End Date Taking? Authorizing Provider  albuterol (VENTOLIN HFA) 108 (90 Base) MCG/ACT inhaler USE 2 PUFFS INTO LUNGS EVERY 4 HOURS AS NEEDED FOR WHEEZING OR SHORTNESS OF BREATH 03/25/18   Babs Sciara, MD  ALPRAZolam (XANAX) 1 MG tablet TAKE ONE TABLET (1 MG TOTAL) BY MOUTH ATBEDTIME AS NEEDED FOR SLEEP. 05/06/23    Babs Sciara, MD  amoxicillin-clavulanate (AUGMENTIN) 875-125 MG tablet Take 1 tablet by mouth every 12 (twelve) hours. 07/09/23   Valentino Nose, NP  clindamycin (CLEOCIN) 300 MG capsule Take 1 capsule (300 mg total) by mouth 3 (three) times daily for 7 days. 07/09/23 07/16/23  Valentino Nose, NP  Cyanocobalamin (VITAMIN B 12 PO) Take by mouth.    [provider]  fish oil-omega-3 fatty acids 1000 MG capsule Take 2 g by mouth daily.    [provider]  glucosamine-chondroitin 500-400 MG tablet Take 1 tablet by mouth 3 (three) times daily.    [provider]  hydrochlorothiazide (HYDRODIURIL) 25 MG tablet 1/2 to 1 qam as needed edema 03/09/23   Babs Sciara, MD  lisinopril (ZESTRIL) 10 MG tablet 1 qam 05/06/23   Luking, Jonna Coup, MD  metFORMIN (GLUCOPHAGE) 500 MG tablet TAKE ONE (1) TABLET BY MOUTH EVERY DAY WITH A MEAL 05/06/23   Babs Sciara, MD  omeprazole (PRILOSEC) 20 MG capsule 1 qd 05/06/23   Babs Sciara, MD  rosuvastatin (CRESTOR) 20 MG tablet TAKE ONE TABLET (20MG  TOTAL) BY MOUTH DAILY STOP PRAVASTATIN 04/24/23   Babs Sciara, MD  sertraline (ZOLOFT) 100 MG tablet Take 1.5 tablets (150 mg total) by mouth daily. 05/06/23   Babs Sciara, MD      Allergies    Elemental sulfur, Sulfa antibiotics, Voltaren [diclofenac sodium], and Ozempic (0.25 or 0.5 mg-dose) [semaglutide(0.25 or 0.5mg -dos)]  Review of Systems   Review of Systems  Constitutional:  Negative for fever.  HENT:  Negative for congestion.   Eyes:  Positive for redness. Negative for discharge and visual disturbance.  Respiratory:  Negative for chest tightness and shortness of breath.   Cardiovascular:  Negative for chest pain.  Gastrointestinal:  Negative for abdominal pain and nausea.  Genitourinary: Negative.   Musculoskeletal:  Negative for arthralgias, joint swelling and neck pain.  Skin: Negative.  Negative for rash and wound.  Neurological:  Negative for dizziness,  weakness, light-headedness, numbness and headaches.  Psychiatric/Behavioral: Negative.      Physical Exam Updated Vital Signs BP 135/75 (BP Location: Left Arm)   Pulse 77   Temp 98.9 F (37.2 C) (Oral)   Resp 20   Ht 4\' 11"  (1.499 m)   Wt 76.2 kg   SpO2 97%   BMI 33.93 kg/m  Physical Exam Vitals and nursing note reviewed.  Constitutional:      Appearance: She is well-developed.  HENT:     Head: Normocephalic and atraumatic.  Eyes:     General: Vision grossly intact. No visual field deficit.       Right eye: No discharge.        Left eye: No discharge.     Extraocular Movements: Extraocular movements intact.     Conjunctiva/sclera: Conjunctivae normal.     Pupils: Pupils are equal, round, and reactive to light.     Left eye: No corneal abrasion or fluorescein uptake.     Slit lamp exam:    Left eye: Anterior chamber quiet. No photophobia.     Comments: Mild injection and moderate chemosis of the left upper and lower lids and at lateral canthus. No pain with eye movement.   No fluorescein dye uptake left cornea with woods lamp exam.  OS 20/40,  OD 20/50  OU 20/40   Cardiovascular:     Rate and Rhythm: Normal rate and regular rhythm.     Heart sounds: Normal heart sounds.  Pulmonary:     Effort: Pulmonary effort is normal.     Breath sounds: Normal breath sounds. No wheezing.  Abdominal:     General: Bowel sounds are normal.     Palpations: Abdomen is soft.     Tenderness: There is no abdominal tenderness.  Musculoskeletal:        General: Normal range of motion.     Cervical back: Normal range of motion.  Skin:    General: Skin is warm and dry.  Neurological:     Mental Status: She is alert.     ED Results / Procedures / Treatments   Labs (all labs ordered are listed, but only abnormal results are displayed) Labs Reviewed  CBC WITH DIFFERENTIAL/PLATELET - Abnormal; Notable for the following components:      Result Value   Hemoglobin 11.3 (*)    MCH 24.9  (*)    RDW 17.7 (*)    All other components within normal limits  BASIC METABOLIC PANEL    EKG None  Radiology CT Orbits W Contrast  Result Date: 07/10/2023 CLINICAL DATA:  Initial evaluation for acute left eye swelling for 4 days, periorbital cellulitis. EXAM: CT ORBITS WITH CONTRAST TECHNIQUE: Multidetector CT images was performed according to the standard protocol following intravenous contrast administration. RADIATION DOSE REDUCTION: This exam was performed according to the departmental dose-optimization program which includes automated exposure control, adjustment of the mA and/or kV according to patient size and/or use of iterative  reconstruction technique. CONTRAST:  75mL OMNIPAQUE IOHEXOL 300 MG/ML  SOLN COMPARISON:  None Available. FINDINGS: Orbits: Soft tissue swelling seen about the left preseptal periorbital soft tissues, most notably at the left upper eyelid and lateral aspect of the orbit. Findings are nonspecific, but likely reflect changes of acute preseptal periorbital cellulitis given provided history. No discrete abscess or drainable fluid collection. No intraorbital or postseptal extension by CT. Globes and orbital soft tissues otherwise unremarkable. Visible paranasal sinuses: Visualized paranasal sinuses are clear. Visualized mastoids and middle ear cavities are well pneumatized and free of fluid. Soft tissues: No other soft tissue abnormality about the visualized facial soft tissues. Osseous: No acute osseous finding. Limited intracranial: Unremarkable. IMPRESSION: Soft tissue swelling about the left preseptal periorbital soft tissues, most notably at the left upper eyelid and lateral aspect of the orbit. Findings are nonspecific, but likely reflect changes of acute preseptal periorbital cellulitis given provided history. No discrete abscess or drainable fluid collection. No intraorbital or postseptal extension by CT. Electronically Signed   By: Rise Mu M.D.   On:  07/10/2023 17:53    Procedures Procedures  {Document cardiac monitor, telemetry assessment procedure when appropriate:1}  Medications Ordered in ED Medications  tetracaine (PONTOCAINE) 0.5 % ophthalmic solution 2 drop (has no administration in time range)  fluorescein ophthalmic strip 1 strip (has no administration in time range)  iohexol (OMNIPAQUE) 300 MG/ML solution 75 mL (75 mLs Intravenous Contrast Given 07/10/23 1636)    ED Course/ Medical Decision Making/ A&P   {   Click here for ABCD2, HEART and other calculatorsREFRESH Note before signing :1}                              Medical Decision Making Patient presenting with significant left periorbital eye erythema and swelling, she is on day 2 of clindamycin and Augmentin but woke this morning with increased edema, however it is slightly improved as the day progressed.  Amount and/or Complexity of Data Reviewed Labs: ordered. Radiology: ordered.  Risk Prescription drug management.     {Document critical care time when appropriate:1} {Document review of labs and clinical decision tools ie heart score, Chads2Vasc2 etc:1}  {Document your independent review of radiology images, and any outside records:1} {Document your discussion with family members, caretakers, and with consultants:1} {Document social determinants of health affecting pt's care:1} {Document your decision making why or why not admission, treatments were needed:1} Final Clinical Impression(s) / ED Diagnoses Final diagnoses:  Preseptal cellulitis of left eye    Rx / DC Orders ED Discharge Orders     None

## 2023-07-10 NOTE — Progress Notes (Signed)
   Subjective:    Patient ID: Courtney Hunter, female    DOB: Jul 09, 1966, 57 y.o.   MRN: [id number]  HPI Presents today for recheck of preseptal cellulitis. Started as slight discoloration on Tuesday. Edema began the next day. Was seen at Urgent Care yesterday. Currently on Augmentin and Clindamycin. No fever. Here for recheck as advised by UC.  See UC note in chart.        Objective:   Physical Exam Comparison today to picture in chart from yesterday. Swelling is significantly worse, now under the eye as well. Very tender to the touch. Conjunctivae clear.        Assessment & Plan:   Problem List Items Addressed This Visit       Other   Periorbital cellulitis of left eye - Primary   Due to worsening of her symptoms, patient is going to the local ED. Triage nurse was notified.

## 2023-07-10 NOTE — ED Provider Notes (Signed)
[place] EMERGENCY DEPARTMENT AT [facility] Provider Note   CSN: [id number] Arrival date & time: 07/10/23  1204     History  Chief Complaint  Patient presents with   Eye Problem    Courtney Hunter is a 57 y.o. female with a history including asthma, hyperlipidemia, hypertension and GERD, also prediabetes presenting for evaluation of persistent swelling around her left eye.  She was seen at a urgent care yesterday secondary to left eye swelling which actually started 3 days ago.  She woke with a purple appearance to her left upper lid and over the course of several days she developed increased swelling of the upper lid and lateral eye area.  She was seen at our urgent care yesterday and was placed on clindamycin and Augmentin, presumptive treatment for periorbital cellulitis.  She woke this morning again with increased swelling, saw her PCP who sent her here for CT imaging.  She denies significant pain but does have some discomfort secondary to the swelling, there is been tearing from the eye as well.  She denies foreign body sensation.  She has had no fevers or chills, she denies any visual changes.  She has had 3 doses of the clindamycin and 2 of the Augmentin.  She does wear eyeglasses, no contacts.  She reports having a stye in her medial lower eyelid internally about 6 months ago, she was placed on antibiotics for this including topical and it completely resolved.  The history is provided by the patient.       Home Medications Prior to Admission medications   Medication Sig Start Date End Date Taking? Authorizing Provider  albuterol (VENTOLIN HFA) 108 (90 Base) MCG/ACT inhaler USE 2 PUFFS INTO LUNGS EVERY 4 HOURS AS NEEDED FOR WHEEZING OR SHORTNESS OF BREATH 03/25/18   Babs Sciara, MD  ALPRAZolam (XANAX) 1 MG tablet TAKE ONE TABLET (1 MG TOTAL) BY MOUTH ATBEDTIME AS NEEDED FOR SLEEP. 05/06/23   Babs Sciara, MD  amoxicillin-clavulanate (AUGMENTIN) 875-125 MG tablet  Take 1 tablet by mouth every 12 (twelve) hours. 07/09/23   Valentino Nose, NP  clindamycin (CLEOCIN) 300 MG capsule Take 1 capsule (300 mg total) by mouth 3 (three) times daily for 7 days. 07/09/23 07/16/23  Valentino Nose, NP  Cyanocobalamin (VITAMIN B 12 PO) Take by mouth.    [provider]  fish oil-omega-3 fatty acids 1000 MG capsule Take 2 g by mouth daily.    [provider]  glucosamine-chondroitin 500-400 MG tablet Take 1 tablet by mouth 3 (three) times daily.    [provider]  hydrochlorothiazide (HYDRODIURIL) 25 MG tablet 1/2 to 1 qam as needed edema 03/09/23   Babs Sciara, MD  lisinopril (ZESTRIL) 10 MG tablet 1 qam 05/06/23   Luking, Jonna Coup, MD  metFORMIN (GLUCOPHAGE) 500 MG tablet TAKE ONE (1) TABLET BY MOUTH EVERY DAY WITH A MEAL 05/06/23   Babs Sciara, MD  omeprazole (PRILOSEC) 20 MG capsule 1 qd 05/06/23   Babs Sciara, MD  rosuvastatin (CRESTOR) 20 MG tablet TAKE ONE TABLET (20MG  TOTAL) BY MOUTH DAILY STOP PRAVASTATIN 04/24/23   Babs Sciara, MD  sertraline (ZOLOFT) 100 MG tablet Take 1.5 tablets (150 mg total) by mouth daily. 05/06/23   Babs Sciara, MD      Allergies    Elemental sulfur, Sulfa antibiotics, Voltaren [diclofenac sodium], and Ozempic (0.25 or 0.5 mg-dose) [semaglutide(0.25 or 0.5mg -dos)]    Review of Systems   Review  of Systems  Constitutional:  Negative for fever.  HENT:  Negative for congestion.   Eyes:  Positive for redness. Negative for discharge and visual disturbance.  Respiratory:  Negative for chest tightness and shortness of breath.   Cardiovascular:  Negative for chest pain.  Gastrointestinal:  Negative for abdominal pain and nausea.  Genitourinary: Negative.   Musculoskeletal:  Negative for arthralgias, joint swelling and neck pain.  Skin: Negative.  Negative for rash and wound.  Neurological:  Negative for dizziness, weakness, light-headedness, numbness and headaches.  Psychiatric/Behavioral:  Negative.      Physical Exam Updated Vital Signs BP 135/75 (BP Location: Left Arm)   Pulse 77   Temp 98.9 F (37.2 C) (Oral)   Resp 20   Ht 4\' 11"  (1.499 m)   Wt 76.2 kg   SpO2 97%   BMI 33.93 kg/m  Physical Exam Vitals and nursing note reviewed.  Constitutional:      Appearance: She is well-developed.  HENT:     Head: Normocephalic and atraumatic.  Eyes:     General: Vision grossly intact. No visual field deficit.       Right eye: No discharge.        Left eye: No discharge.     Extraocular Movements: Extraocular movements intact.     Conjunctiva/sclera: Conjunctivae normal.     Pupils: Pupils are equal, round, and reactive to light.     Left eye: No corneal abrasion or fluorescein uptake.     Slit lamp exam:    Left eye: Anterior chamber quiet. No photophobia.     Comments: Mild injection and moderate chemosis of the left upper and lower lids and at lateral canthus. No pain with eye movement.   No fluorescein dye uptake left cornea with woods lamp exam.  OS 20/40,  OD 20/50  OU 20/40   Cardiovascular:     Rate and Rhythm: Normal rate and regular rhythm.     Heart sounds: Normal heart sounds.  Pulmonary:     Effort: Pulmonary effort is normal.     Breath sounds: Normal breath sounds. No wheezing.  Abdominal:     General: Bowel sounds are normal.     Palpations: Abdomen is soft.     Tenderness: There is no abdominal tenderness.  Musculoskeletal:        General: Normal range of motion.     Cervical back: Normal range of motion.  Skin:    General: Skin is warm and dry.  Neurological:     Mental Status: She is alert.     ED Results / Procedures / Treatments   Labs (all labs ordered are listed, but only abnormal results are displayed) Labs Reviewed  CBC WITH DIFFERENTIAL/PLATELET - Abnormal; Notable for the following components:      Result Value   Hemoglobin 11.3 (*)    MCH 24.9 (*)    RDW 17.7 (*)    All other components within normal limits  BASIC  METABOLIC PANEL    EKG None  Radiology CT Orbits W Contrast  Result Date: 07/10/2023 CLINICAL DATA:  Initial evaluation for acute left eye swelling for 4 days, periorbital cellulitis. EXAM: CT ORBITS WITH CONTRAST TECHNIQUE: Multidetector CT images was performed according to the standard protocol following intravenous contrast administration. RADIATION DOSE REDUCTION: This exam was performed according to the departmental dose-optimization program which includes automated exposure control, adjustment of the mA and/or kV according to patient size and/or use of iterative reconstruction technique. CONTRAST:  75mL OMNIPAQUE  IOHEXOL 300 MG/ML  SOLN COMPARISON:  None Available. FINDINGS: Orbits: Soft tissue swelling seen about the left preseptal periorbital soft tissues, most notably at the left upper eyelid and lateral aspect of the orbit. Findings are nonspecific, but likely reflect changes of acute preseptal periorbital cellulitis given provided history. No discrete abscess or drainable fluid collection. No intraorbital or postseptal extension by CT. Globes and orbital soft tissues otherwise unremarkable. Visible paranasal sinuses: Visualized paranasal sinuses are clear. Visualized mastoids and middle ear cavities are well pneumatized and free of fluid. Soft tissues: No other soft tissue abnormality about the visualized facial soft tissues. Osseous: No acute osseous finding. Limited intracranial: Unremarkable. IMPRESSION: Soft tissue swelling about the left preseptal periorbital soft tissues, most notably at the left upper eyelid and lateral aspect of the orbit. Findings are nonspecific, but likely reflect changes of acute preseptal periorbital cellulitis given provided history. No discrete abscess or drainable fluid collection. No intraorbital or postseptal extension by CT. Electronically Signed   By: Rise Mu M.D.   On: 07/10/2023 17:53    Procedures Procedures    Medications Ordered in  ED Medications  tetracaine (PONTOCAINE) 0.5 % ophthalmic solution 2 drop (has no administration in time range)  fluorescein ophthalmic strip 1 strip (has no administration in time range)  iohexol (OMNIPAQUE) 300 MG/ML solution 75 mL (75 mLs Intravenous Contrast Given 07/10/23 1636)    ED Course/ Medical Decision Making/ A&P                                 Medical Decision Making Patient presenting with significant left periorbital eye erythema and swelling, she is on day 2 of clindamycin and Augmentin but woke this morning with increased edema, however it is slightly improved as the day progressed.  No globe pain, she has soft chemosis like edema which could also reflect an inflammatory process.  She denies any injury to the eye.  Amount and/or Complexity of Data Reviewed Labs: ordered.    Details: Labs are essentially normal, she has got a normal WBC count at 6.4, her be met is normal, glucose 96. Radiology: ordered.    Details: CT of the orbits revealing soft tissue swelling at the left preseptal tissues, nonspecific could represent cellulitis versus inflammation.  No abscess and no intraorbital or postseptal extension. Discussion of management or test interpretation with external provider(s): Patient was discussed with Dr. Allena Katz of ophthalmology.  He has recommended patient continue her current antibiotics and to sleep elevated to help minimize continued swelling.  He will see the patient in his office on Monday, she is to call for an appointment time.  In the interim strict return precautions were given the patient including development of fever, or swelling, pain with eye movement or if the soft swelling becomes indurated.  This was discussed with patient who understands the plan.  Risk Decision regarding hospitalization. Risk Details: No indication for hospitalization or change in therapy at this time.           Final Clinical Impression(s) / ED Diagnoses Final diagnoses:   Preseptal cellulitis of left eye    Rx / DC Orders ED Discharge Orders     None         Victoriano Lain 07/11/23 0009    Sloan Leiter, DO 07/13/23 1557

## 2023-07-10 NOTE — Discharge Instructions (Signed)
Continue with your current antibiotic regimen.  We also recommend that you sleep with your head elevated which will help minimize swelling.  It will be important that you have follow-up with ophthalmology, call Dr. Eliane Decree office Monday morning for an appointment time with him on Monday.  If your symptoms worsen in any way, call him sooner as he will need closer follow-up.  Worsening symptoms would include decreased vision, pain that worsens with eye movement, increase redness, firmness of the areas that are currently softly swollen, fevers.

## 2023-07-10 NOTE — ED Triage Notes (Signed)
LEFT eye swelling Seen at urgent care Thursday given ABX Sent from PCP due to increased swelling  Still taking Augmentin and clindamycin

## 2023-08-10 ENCOUNTER — Other Ambulatory Visit: Payer: Self-pay | Admitting: Family Medicine

## 2023-08-21 NOTE — Progress Notes (Unsigned)
[facility]   [phone] [phone]) [phone]   Clinic New Consult Note   Patient Care Team: Babs Sciara, MD as PCP - General (Family Medicine) 08/22/2023  CHIEF COMPLAINTS/PURPOSE OF CONSULTATION:  Iron deficient anemia   REFERRING PHYSICIAN: Dr. Bosie Clos   Discussed the use of AI scribe software for clinical note transcription with the patient, who gave verbal consent to proceed.  History of Present Illness   Courtney Hunter, a patient with a history of diabetes, high blood pressure, and reactive airway disease, presents with anemia. She first noticed symptoms after donating blood in March of the current year. Following the donation, she experienced an episode of syncope, which she initially attributed to the blood donation. However, she continued to experience symptoms, including dizziness and visual disturbances, in the following months. She also reports a decrease in energy levels, which she initially attributed to laziness.  Upon consultation with her primary care physician, Dr. Gerda Diss, labs showed mild anemia with low iron saturation and a ferritin, she was advised to take iron supplements every other day. She has been compliant with this regimen. Despite this, she reports no significant improvement in her symptoms.  In an attempt to identify a potential source of blood loss, she has undergone a series of tests, including stool sample testing, colonoscopy, upper endoscopy, and a capsule endoscopy. The stool sample tested positive, but the endoscopies and capsule endoscopy did not reveal any significant findings.         MEDICAL HISTORY:  Past Medical History:  Diagnosis Date   Allergy    Diabetes mellitus without complication (HCC)    Type II   Hyperlipidemia    Hypertension    ICS (immotile cilia syndrome)    Reactive airway disease     SURGICAL HISTORY: Past Surgical History:  Procedure Laterality Date   ABLATION     APPENDECTOMY      CHOLECYSTECTOMY     DILATION AND CURETTAGE OF UTERUS      SOCIAL HISTORY: Social History   Socioeconomic History   Marital status: Married    Spouse name: Not on file   Number of children: 1   Years of education: Not on file   Highest education level: Not on file  Occupational History   Not on file  Tobacco Use   Smoking status: Never   Smokeless tobacco: Never  Vaping Use   Vaping status: Never Used  Substance and Sexual Activity   Alcohol use: Never   Drug use: Never   Sexual activity: Not on file  Other Topics Concern   Not on file  Social History Narrative   Not on file   Social Determinants of Health   Financial Resource Strain: Not on file  Food Insecurity: No Food Insecurity (08/22/2023)   Hunger Vital Sign    Worried About Running Out of Food in the Last Year: Never true    Ran Out of Food in the Last Year: Never true  Transportation Needs: No Transportation Needs (08/22/2023)   PRAPARE - Administrator, Civil Service (Medical): No    Lack of Transportation (Non-Medical): No  Physical Activity: Not on file  Stress: Not on file  Social Connections: Not on file  Intimate Partner Violence: Not At Risk (08/22/2023)   Humiliation, Afraid, Rape, and Kick questionnaire    Fear of Current or Ex-Partner: No    Emotionally Abused: No    Physically Abused: No    Sexually Abused: No  FAMILY HISTORY: History reviewed. No pertinent family history.  ALLERGIES:  is allergic to elemental sulfur, sulfa antibiotics, voltaren [diclofenac sodium], and ozempic (0.25 or 0.5 mg-dose) [semaglutide(0.25 or 0.5mg -dos)].  MEDICATIONS:  Current Outpatient Medications  Medication Sig Dispense Refill   albuterol (VENTOLIN HFA) 108 (90 Base) MCG/ACT inhaler USE 2 PUFFS INTO LUNGS EVERY 4 HOURS AS NEEDED FOR WHEEZING OR SHORTNESS OF BREATH 18 g 5   ALPRAZolam (XANAX) 1 MG tablet TAKE ONE TABLET (1 MG TOTAL) BY MOUTH ATBEDTIME AS NEEDED FOR SLEEP. 30 tablet 5    Cyanocobalamin (VITAMIN B 12 PO) Take by mouth.     fish oil-omega-3 fatty acids 1000 MG capsule Take 2 g by mouth daily.     glucosamine-chondroitin 500-400 MG tablet Take 1 tablet by mouth 3 (three) times daily.     hydrochlorothiazide (HYDRODIURIL) 25 MG tablet TAKE 1/2 TO 1 TABLET BY MOUTH EVERY MORNING AS NEEDED FOR EDEMA 90 tablet 1   lisinopril (ZESTRIL) 10 MG tablet 1 qam 90 tablet 1   metFORMIN (GLUCOPHAGE) 500 MG tablet TAKE ONE (1) TABLET BY MOUTH EVERY DAY WITH A MEAL 90 tablet 1   omeprazole (PRILOSEC) 20 MG capsule 1 qd 90 capsule 3   rosuvastatin (CRESTOR) 20 MG tablet TAKE ONE TABLET (20MG  TOTAL) BY MOUTH DAILY STOP PRAVASTATIN 90 tablet 1   sertraline (ZOLOFT) 100 MG tablet Take 1.5 tablets (150 mg total) by mouth daily. 135 tablet 1   No current facility-administered medications for this visit.    REVIEW OF SYSTEMS:   Constitutional: Denies fevers, chills or abnormal night sweats, (+) mild fatigue  Eyes: Denies blurriness of vision, double vision or watery eyes Ears, nose, mouth, throat, and face: Denies mucositis or sore throat Respiratory: Denies cough, dyspnea or wheezes Cardiovascular: Denies palpitation, chest discomfort or lower extremity swelling Gastrointestinal:  Denies nausea, heartburn or change in bowel habits Skin: Denies abnormal skin rashes Lymphatics: Denies new lymphadenopathy or easy bruising Neurological:Denies numbness, tingling or new weaknesses Behavioral/Psych: Mood is stable, no new changes  All other systems were reviewed with the patient and are negative.  PHYSICAL EXAMINATION: ECOG PERFORMANCE STATUS: 0 - Asymptomatic  Vitals:   08/22/23 0842  BP: (!) 146/83  Pulse: 73  Resp: 18  Temp: (!) 97.3 F (36.3 C)  SpO2: 99%   Filed Weights   08/22/23 0842  Weight: 175 lb 12.8 oz (79.7 kg)    GENERAL:alert, no distress and comfortable SKIN: skin color, texture, turgor are normal, no rashes or significant lesions EYES: normal,  conjunctiva are pink and non-injected, sclera clear OROPHARYNX:no exudate, no erythema and lips, buccal mucosa, and tongue normal  NECK: supple, thyroid normal size, non-tender, without nodularity LYMPH:  no palpable lymphadenopathy in the cervical, axillary or inguinal LUNGS: clear to auscultation and percussion with normal breathing effort HEART: regular rate & rhythm and no murmurs and no lower extremity edema ABDOMEN:abdomen soft, non-tender and normal bowel sounds Musculoskeletal:no cyanosis of digits and no clubbing  PSYCH: alert & oriented x 3 with fluent speech NEURO: no focal motor/sensory deficits       LABORATORY DATA:  I have reviewed the data as listed    Latest Ref Rng & Units 07/10/2023    3:21 PM 05/19/2023    1:42 PM 03/05/2016   10:08 AM  CBC  WBC 4.0 - 10.5 K/uL 6.4  6.4  6.3   Hemoglobin 12.0 - 15.0 g/dL 78.4  69.6  29.5   Hematocrit 36.0 - 46.0 % 36.7  34.0  41.0   Platelets 150 - 400 K/uL 277  291  358       Latest Ref Rng & Units 07/10/2023    3:21 PM 04/16/2023    9:12 AM 04/28/2022    9:34 AM  CMP  Glucose 70 - 99 mg/dL 96  387  564   BUN 6 - 20 mg/dL 15  15  13    Creatinine 0.44 - 1.00 mg/dL 3.32  9.51  8.84   Sodium 135 - 145 mmol/L 135  140  137   Potassium 3.5 - 5.1 mmol/L 3.6  4.1  3.9   Chloride 98 - 111 mmol/L 100  100  98   CO2 22 - 32 mmol/L 27  26  25    Calcium 8.9 - 10.3 mg/dL 9.5  9.6  9.7   Total Protein 6.0 - 8.5 g/dL  7.1  7.1   Total Bilirubin 0.0 - 1.2 mg/dL  0.2  0.3   Alkaline Phos 44 - 121 IU/L  53  46   AST 0 - 40 IU/L  24  21   ALT 0 - 32 IU/L  16  20      RADIOGRAPHIC STUDIES: I have personally reviewed the radiological images as listed and agreed with the findings in the report. No results found.  ASSESSMENT & PLAN:  57 yo postmenopausal female     Iron Deficiency Anemia New onset iron deficiency anemia likely secondary to blood loss from blood donation. Initial hemoglobin was 12.4 g/dL in March, dropped to 16.6 g/dL  in October, and improved to 11.3 g/dL in November after starting iron supplementation. GI workup including colonoscopy, upper endoscopy, and capsule endoscopy did not reveal significant bleeding sources. Positive stool occult blood test suggests possible intermittent GI bleeding. No evidence of malignancy or significant ulceration. Symptoms include dizziness, visual floaters, and fatigue. Iron supplementation has shown some improvement in hemoglobin levels. Discussed potential need for IV iron if oral supplementation is insufficient. Emphasized importance of continued iron supplementation and dietary adjustments. - Order repeat iron studies, CBC, B12 and folate levels, and reticulocyte count - Continue oral iron supplementation (325 mg ferrous sulfate) with vitamin C or orange juice to enhance absorption - Advise increased dietary intake of red meat, chicken, and fish - Consider IV iron if oral supplementation is insufficient - Follow up with primary care physician in February for further monitoring  Hypertension Chronic hypertension managed with hydrochlorothiazide and lisinopril. - Continue current antihypertensive medications  Diabetes Mellitus (Prediabetes) Long-standing prediabetes managed with metformin. Most recent HbA1c was 5.9%. - Continue metformin 500 mg daily - Monitor HbA1c regularly  Gastroesophageal Reflux Disease (GERD) Chronic GERD managed with omeprazole. - Continue omeprazole  General Health Maintenance Routine health maintenance discussed. Mammogram scheduled for February. Advised on diet and exercise for overall health, considering stationary bike due to sciatic nerve pain. - Encourage low-carb, high-protein diet - Encourage regular exercise, consider stationary bike due to sciatic nerve pain - Follow up with mammogram in February  Follow-up - Follow up with primary care physician in February - lab today and call patient with lab results  - Send note to primary care  physician.         Orders Placed This Encounter  Procedures   Folate RBC    Standing Status:   Future    Standing Expiration Date:   08/21/2024   Ferritin    Standing Status:   Standing    Number of Occurrences:   20    Standing  Expiration Date:   08/21/2024   CBC with Differential/Platelet    Standing Status:   Standing    Number of Occurrences:   50    Standing Expiration Date:   08/21/2024   Vitamin B12    Standing Status:   Future    Standing Expiration Date:   08/21/2024   Retic Panel    Standing Status:   Future    Standing Expiration Date:   08/21/2024    All questions were answered. The patient knows to call the clinic with any problems, questions or concerns. I spent 25 minutes counseling the patient face to face. The total time spent in the appointment was 30 minutes and more than 50% was on counseling.     Malachy Mood, MD 08/22/2023 9:02 AM

## 2023-08-22 ENCOUNTER — Inpatient Hospital Stay: Payer: 59 | Attending: Hematology | Admitting: Hematology

## 2023-08-22 ENCOUNTER — Inpatient Hospital Stay: Payer: 59

## 2023-08-22 ENCOUNTER — Encounter: Payer: Self-pay | Admitting: Hematology

## 2023-08-22 VITALS — BP 146/83 | HR 73 | Temp 97.3°F | Resp 18 | Ht 59.0 in | Wt 175.8 lb

## 2023-08-22 DIAGNOSIS — D500 Iron deficiency anemia secondary to blood loss (chronic): Secondary | ICD-10-CM

## 2023-08-22 DIAGNOSIS — D649 Anemia, unspecified: Secondary | ICD-10-CM

## 2023-08-22 DIAGNOSIS — K219 Gastro-esophageal reflux disease without esophagitis: Secondary | ICD-10-CM | POA: Insufficient documentation

## 2023-08-22 DIAGNOSIS — D509 Iron deficiency anemia, unspecified: Secondary | ICD-10-CM | POA: Insufficient documentation

## 2023-08-22 DIAGNOSIS — I10 Essential (primary) hypertension: Secondary | ICD-10-CM | POA: Insufficient documentation

## 2023-08-22 DIAGNOSIS — E119 Type 2 diabetes mellitus without complications: Secondary | ICD-10-CM | POA: Diagnosis not present

## 2023-08-22 LAB — CBC WITH DIFFERENTIAL/PLATELET
Abs Immature Granulocytes: 0.01 10*3/uL (ref 0.00–0.07)
Basophils Absolute: 0.1 10*3/uL (ref 0.0–0.1)
Basophils Relative: 1 %
Eosinophils Absolute: 0.1 10*3/uL (ref 0.0–0.5)
Eosinophils Relative: 2 %
HCT: 35.1 % — ABNORMAL LOW (ref 36.0–46.0)
Hemoglobin: 11.2 g/dL — ABNORMAL LOW (ref 12.0–15.0)
Immature Granulocytes: 0 %
Lymphocytes Relative: 29 %
Lymphs Abs: 1.7 10*3/uL (ref 0.7–4.0)
MCH: 26.1 pg (ref 26.0–34.0)
MCHC: 31.9 g/dL (ref 30.0–36.0)
MCV: 81.8 fL (ref 80.0–100.0)
Monocytes Absolute: 0.6 10*3/uL (ref 0.1–1.0)
Monocytes Relative: 11 %
Neutro Abs: 3.2 10*3/uL (ref 1.7–7.7)
Neutrophils Relative %: 57 %
Platelets: 277 10*3/uL (ref 150–400)
RBC: 4.29 MIL/uL (ref 3.87–5.11)
RDW: 15.9 % — ABNORMAL HIGH (ref 11.5–15.5)
WBC: 5.7 10*3/uL (ref 4.0–10.5)
nRBC: 0 % (ref 0.0–0.2)

## 2023-08-22 LAB — RETIC PANEL
Immature Retic Fract: 15.3 % (ref 2.3–15.9)
RBC.: 4.28 MIL/uL (ref 3.87–5.11)
Retic Count, Absolute: 62.5 10*3/uL (ref 19.0–186.0)
Retic Ct Pct: 1.5 % (ref 0.4–3.1)
Reticulocyte Hemoglobin: 27.4 pg — ABNORMAL LOW (ref >27.9)

## 2023-08-22 LAB — VITAMIN B12: Vitamin B-12: 463 pg/mL (ref 180–914)

## 2023-08-22 LAB — FERRITIN: Ferritin: 4 ng/mL — ABNORMAL LOW (ref 11–307)

## 2023-08-24 ENCOUNTER — Encounter: Payer: Self-pay | Admitting: Hematology

## 2023-08-24 ENCOUNTER — Telehealth: Payer: Self-pay

## 2023-08-24 NOTE — Telephone Encounter (Addendum)
Called patient and relayed message below as per Dr. Mosetta Putt. Patient voiced full understanding. Also forwarded the message to Scheduling to make the up coming appointments.   ----- Message from Malachy Mood sent at 08/23/2023  2:44 PM EST ----- Please let pt know her lab result, iron level still very low, OK to continue oral iron for 2-3 lab then decide if she needs iv iron, please schedule phone visit in 3 months with lab a week before if she agrees, thanks   [employer]

## 2023-08-25 ENCOUNTER — Telehealth: Payer: Self-pay | Admitting: Hematology

## 2023-08-25 LAB — FOLATE RBC
Folate, Hemolysate: 293 ng/mL
Folate, RBC: 816 ng/mL (ref >498)
Hematocrit: 35.9 % (ref 34.0–46.6)

## 2023-10-21 ENCOUNTER — Other Ambulatory Visit: Payer: Self-pay

## 2023-10-21 ENCOUNTER — Encounter: Payer: Self-pay | Admitting: Family Medicine

## 2023-10-21 DIAGNOSIS — Z79899 Other long term (current) drug therapy: Secondary | ICD-10-CM

## 2023-10-21 DIAGNOSIS — E785 Hyperlipidemia, unspecified: Secondary | ICD-10-CM

## 2023-10-21 DIAGNOSIS — I10 Essential (primary) hypertension: Secondary | ICD-10-CM

## 2023-10-21 DIAGNOSIS — R7303 Prediabetes: Secondary | ICD-10-CM

## 2023-10-21 NOTE — Telephone Encounter (Signed)
Nurses-please order metabolic 7, lipid, liver, A1c, urine ACR Prediabetes, hypertension, hyperlipidemia, high risk med

## 2023-11-03 ENCOUNTER — Encounter: Payer: Self-pay | Admitting: Family Medicine

## 2023-11-03 LAB — BASIC METABOLIC PANEL
BUN/Creatinine Ratio: 13 (ref 9–23)
BUN: 12 mg/dL (ref 6–24)
CO2: 24 mmol/L (ref 20–29)
Calcium: 10.3 mg/dL — ABNORMAL HIGH (ref 8.7–10.2)
Chloride: 98 mmol/L (ref 96–106)
Creatinine, Ser: 0.91 mg/dL (ref 0.57–1.00)
Glucose: 119 mg/dL — ABNORMAL HIGH (ref 70–99)
Potassium: 4.1 mmol/L (ref 3.5–5.2)
Sodium: 140 mmol/L (ref 134–144)
eGFR: 73 mL/min/{1.73_m2} (ref >59)

## 2023-11-03 LAB — MICROALBUMIN / CREATININE URINE RATIO
Creatinine, Urine: 141.6 mg/dL
Microalb/Creat Ratio: 7 mg/g{creat} (ref 0–29)
Microalbumin, Urine: 9.8 ug/mL

## 2023-11-03 LAB — LIPID PANEL
Chol/HDL Ratio: 2.5 {ratio} (ref 0.0–4.4)
Cholesterol, Total: 151 mg/dL (ref 100–199)
HDL: 61 mg/dL (ref >39)
LDL Chol Calc (NIH): 61 mg/dL (ref 0–99)
Triglycerides: 173 mg/dL — ABNORMAL HIGH (ref 0–149)
VLDL Cholesterol Cal: 29 mg/dL (ref 5–40)

## 2023-11-03 LAB — HEPATIC FUNCTION PANEL
ALT: 26 [IU]/L (ref 0–32)
AST: 27 [IU]/L (ref 0–40)
Albumin: 4.7 g/dL (ref 3.8–4.9)
Alkaline Phosphatase: 55 [IU]/L (ref 44–121)
Bilirubin Total: 0.3 mg/dL (ref 0.0–1.2)
Bilirubin, Direct: 0.1 mg/dL (ref 0.00–0.40)
Total Protein: 7.5 g/dL (ref 6.0–8.5)

## 2023-11-03 LAB — HEMOGLOBIN A1C
Est. average glucose Bld gHb Est-mCnc: 123 mg/dL
Hgb A1c MFr Bld: 5.9 % — ABNORMAL HIGH (ref 4.8–5.6)

## 2023-11-04 ENCOUNTER — Inpatient Hospital Stay: Payer: 59 | Attending: Hematology

## 2023-11-04 ENCOUNTER — Ambulatory Visit: Payer: 59 | Admitting: Family Medicine

## 2023-11-04 VITALS — BP 118/63 | HR 80 | Ht 59.0 in | Wt 178.0 lb

## 2023-11-04 DIAGNOSIS — Z23 Encounter for immunization: Secondary | ICD-10-CM | POA: Diagnosis not present

## 2023-11-04 DIAGNOSIS — I10 Essential (primary) hypertension: Secondary | ICD-10-CM

## 2023-11-04 DIAGNOSIS — R7303 Prediabetes: Secondary | ICD-10-CM

## 2023-11-04 DIAGNOSIS — D509 Iron deficiency anemia, unspecified: Secondary | ICD-10-CM | POA: Diagnosis not present

## 2023-11-04 DIAGNOSIS — G4700 Insomnia, unspecified: Secondary | ICD-10-CM | POA: Diagnosis not present

## 2023-11-04 DIAGNOSIS — D500 Iron deficiency anemia secondary to blood loss (chronic): Secondary | ICD-10-CM

## 2023-11-04 DIAGNOSIS — Z7984 Long term (current) use of oral hypoglycemic drugs: Secondary | ICD-10-CM

## 2023-11-04 DIAGNOSIS — E785 Hyperlipidemia, unspecified: Secondary | ICD-10-CM | POA: Diagnosis not present

## 2023-11-04 LAB — CBC WITH DIFFERENTIAL/PLATELET
Abs Immature Granulocytes: 0.01 10*3/uL (ref 0.00–0.07)
Basophils Absolute: 0.1 10*3/uL (ref 0.0–0.1)
Basophils Relative: 1 %
Eosinophils Absolute: 0.2 10*3/uL (ref 0.0–0.5)
Eosinophils Relative: 3 %
HCT: 38 % (ref 36.0–46.0)
Hemoglobin: 12.5 g/dL (ref 12.0–15.0)
Immature Granulocytes: 0 %
Lymphocytes Relative: 33 %
Lymphs Abs: 2.1 10*3/uL (ref 0.7–4.0)
MCH: 28.9 pg (ref 26.0–34.0)
MCHC: 32.9 g/dL (ref 30.0–36.0)
MCV: 88 fL (ref 80.0–100.0)
Monocytes Absolute: 0.9 10*3/uL (ref 0.1–1.0)
Monocytes Relative: 13 %
Neutro Abs: 3.1 10*3/uL (ref 1.7–7.7)
Neutrophils Relative %: 50 %
Platelets: 240 10*3/uL (ref 150–400)
RBC: 4.32 MIL/uL (ref 3.87–5.11)
RDW: 17.2 % — ABNORMAL HIGH (ref 11.5–15.5)
WBC: 6.4 10*3/uL (ref 4.0–10.5)
nRBC: 0 % (ref 0.0–0.2)

## 2023-11-04 LAB — FERRITIN: Ferritin: 12 ng/mL (ref 11–307)

## 2023-11-04 MED ORDER — OMEPRAZOLE 20 MG PO CPDR: DELAYED_RELEASE_CAPSULE | ORAL | 3 refills | Status: DC

## 2023-11-04 MED ORDER — ROSUVASTATIN CALCIUM 20 MG PO TABS: 20.0000 mg | ORAL_TABLET | Freq: Every day | ORAL | 1 refills | Status: DC

## 2023-11-04 MED ORDER — LISINOPRIL 10 MG PO TABS: ORAL_TABLET | ORAL | 1 refills | Status: DC

## 2023-11-04 MED ORDER — METFORMIN HCL 500 MG PO TABS: ORAL_TABLET | ORAL | 1 refills | Status: DC

## 2023-11-04 MED ORDER — ALPRAZOLAM 1 MG PO TABS: ORAL_TABLET | ORAL | 5 refills | Status: DC

## 2023-11-04 MED ORDER — SERTRALINE HCL 100 MG PO TABS: 150.0000 mg | ORAL_TABLET | Freq: Every day | ORAL | 1 refills | Status: DC

## 2023-11-04 NOTE — Progress Notes (Signed)
 Subjective:    Patient ID: Courtney Hunter, female    DOB: 03-Apr-1966, 58 y.o.   MRN: [id number]  HPI  Patient's weight is gone up a few pounds She states dietary she could do better but wintertime is difficult for her She has plans on minimizing carbohydrates improving quality of reading and fitting in some walking as the weather improves She does have prediabetes she takes metformin  Has not been able to tolerate Ozempic  in the past Does take her blood pressure medicine regular basis Takes her cholesterol medicine regular basis Patient relates reflux under good control with medication Patient also relates that moods are doing well with the sertraline  her son is going back to [place] to teach and will be going to [place] in the future Being treated currently for iron deficient anemia has had thorough workup including endoscopies and capsule Review of Systems     Objective:   Physical Exam General-in no acute distress Eyes-no discharge Lungs-respiratory rate normal, CTA CV-no murmurs,RRR Extremities skin warm dry no edema Neuro grossly normal Behavior normal, alert  Results for orders placed or performed in visit on 10/21/23  Lipid Panel   Collection Time: 11/02/23  9:08 AM  Result Value Ref Range   Cholesterol, Total 151 100 - 199 mg/dL   Triglycerides 826 (H) 0 - 149 mg/dL   HDL 61 >60 mg/dL   VLDL Cholesterol Cal 29 5 - 40 mg/dL   LDL Chol Calc (NIH) 61 0 - 99 mg/dL   Chol/HDL Ratio 2.5 0.0 - 4.4 ratio  Basic Metabolic Panel   Collection Time: 11/02/23  9:08 AM  Result Value Ref Range   Glucose 119 (H) 70 - 99 mg/dL   BUN 12 6 - 24 mg/dL   Creatinine, Ser 9.08 0.57 - 1.00 mg/dL   eGFR 73 >40 fO/fpw/8.26   BUN/Creatinine Ratio 13 9 - 23   Sodium 140 134 - 144 mmol/L   Potassium 4.1 3.5 - 5.2 mmol/L   Chloride 98 96 - 106 mmol/L   CO2 24 20 - 29 mmol/L   Calcium  10.3 (H) 8.7 - 10.2 mg/dL  Hepatic Function Panel   Collection Time: 11/02/23  9:08 AM  Result Value  Ref Range   Total Protein 7.5 6.0 - 8.5 g/dL   Albumin 4.7 3.8 - 4.9 g/dL   Bilirubin Total 0.3 0.0 - 1.2 mg/dL   Bilirubin, Direct 9.89 0.00 - 0.40 mg/dL   Alkaline Phosphatase 55 44 - 121 IU/L   AST 27 0 - 40 IU/L   ALT 26 0 - 32 IU/L  Hemoglobin A1c   Collection Time: 11/02/23  9:08 AM  Result Value Ref Range   Hgb A1c MFr Bld 5.9 (H) 4.8 - 5.6 %   Est. average glucose Bld gHb Est-mCnc 123 mg/dL  Microalbumin / creatinine urine ratio   Collection Time: 11/02/23  9:08 AM  Result Value Ref Range   Creatinine, Urine 141.6 Not Estab. mg/dL   Microalbumin, Urine 9.8 Not Estab. ug/mL   Microalb/Creat Ratio 7 0 - 29 mg/g creat   Labs were reviewed in detail      Assessment & Plan:  1. Immunization due (Primary) Pneumonia shot today - Pneumococcal conjugate vaccine 20-valent (Prevnar 20)  2. Prediabetes A1c stable.  Continue metformin   3. Essential hypertension, benign Blood pressure good control continue current measures  4. Hyperlipidemia, unspecified hyperlipidemia type Cholesterol profile looks good LDL below 70 continue statin no need to take fish oil  5. Insomnia, unspecified type  Has used Xanax  long-term in the evening time to help with rest May continue for now  6. Iron deficiency anemia, unspecified iron deficiency anemia type Sees hematology they have her under their care possibly to do infusion  7. Hypercalcemia Calcium  slightly elevated we will recheck that again if it persists elevated on 33-month follow-up then will need further testing to delineate the cause  Moods are doing well continue sertraline  Morbid obesity portion control regular physical activity GERD under good control with medication

## 2023-11-05 ENCOUNTER — Encounter: Payer: Self-pay | Admitting: Nurse Practitioner

## 2023-11-06 ENCOUNTER — Ambulatory Visit: Payer: 59 | Admitting: Family Medicine

## 2023-11-10 ENCOUNTER — Encounter: Payer: Self-pay | Admitting: Nurse Practitioner

## 2023-11-10 ENCOUNTER — Ambulatory Visit: Payer: 59 | Admitting: Nurse Practitioner

## 2023-11-10 DIAGNOSIS — D500 Iron deficiency anemia secondary to blood loss (chronic): Secondary | ICD-10-CM | POA: Diagnosis not present

## 2023-11-10 NOTE — Progress Notes (Signed)
Patient Care Team: Babs Sciara, MD as PCP - General (Family Medicine)   I connected with Courtney Hunter on 11/10/23 at 11:00 AM EST by telephone visit and verified that I am speaking with the correct person using two identifiers.   I discussed the limitations, risks, security and privacy concerns of performing an evaluation and management service by telemedicine and the availability of in-person appointments. I also discussed with the patient that there may be a patient responsible charge related to this service. The patient expressed understanding and agreed to proceed.   Other persons participating in the visit and their role in the encounter: none   Patient's location: work, office  Provider's location: home    CHIEF COMPLAINT: Follow up IDA, lab results   CURRENT THERAPY: Oral iron  INTERVAL HISTORY Courtney Hunter presents for phone f/up. Last seen by Dr. Mosetta Putt 08/22/23 as a new patient. On oral iron every other day x3 months, some constipation but tolerating. Takes miralax PRN. No blood in stool or other obvious bleeding. Feels about the same but the floaters/spots and swishing feeling in her ear have improved. Probably will not donate blood again.  ROS  All other systems reviewed and negative  Past Medical History:  Diagnosis Date   Allergy    Diabetes mellitus without complication (HCC)    Type II   Hyperlipidemia    Hypertension    ICS (immotile cilia syndrome)    Reactive airway disease      Past Surgical History:  Procedure Laterality Date   ABLATION     APPENDECTOMY     CHOLECYSTECTOMY     DILATION AND CURETTAGE OF UTERUS       Outpatient Encounter Medications as of 11/10/2023  Medication Sig Note   albuterol (VENTOLIN HFA) 108 (90 Base) MCG/ACT inhaler USE 2 PUFFS INTO LUNGS EVERY 4 HOURS AS NEEDED FOR WHEEZING OR SHORTNESS OF BREATH (Patient not taking: Reported on 11/04/2023) 02/13/2021: prn   ALPRAZolam (XANAX) 1 MG tablet TAKE ONE TABLET (1 MG TOTAL) BY  MOUTH ATBEDTIME AS NEEDED FOR SLEEP.    Cyanocobalamin (VITAMIN B 12 PO) Take by mouth.    glucosamine-chondroitin 500-400 MG tablet Take 1 tablet by mouth 3 (three) times daily.    hydrochlorothiazide (HYDRODIURIL) 25 MG tablet TAKE 1/2 TO 1 TABLET BY MOUTH EVERY MORNING AS NEEDED FOR EDEMA    lisinopril (ZESTRIL) 10 MG tablet 1 qam    metFORMIN (GLUCOPHAGE) 500 MG tablet TAKE ONE (1) TABLET BY MOUTH EVERY DAY WITH A MEAL    omeprazole (PRILOSEC) 20 MG capsule 1 qd    OVER THE COUNTER MEDICATION Iron 325 eod    rosuvastatin (CRESTOR) 20 MG tablet Take 1 tablet (20 mg total) by mouth daily.    sertraline (ZOLOFT) 100 MG tablet Take 1.5 tablets (150 mg total) by mouth daily.    No facility-administered encounter medications on file as of 11/10/2023.     There were no vitals filed for this visit. There is no height or weight on file to calculate BMI.   PHYSICAL EXAM Pt appears well by phone. Voice is strong, speech is clear. Mood/affect appear normal for situation. No cough or conversational dyspnea  CBC    Component Value Date/Time   WBC 6.4 11/04/2023 1408   RBC 4.32 11/04/2023 1408   HGB 12.5 11/04/2023 1408   HGB 10.2 (L) 05/19/2023 1342   HCT 38.0 11/04/2023 1408   HCT 35.9 08/22/2023 0903   PLT 240 11/04/2023 1408  PLT 291 05/19/2023 1342   MCV 88.0 11/04/2023 1408   MCV 78 (L) 05/19/2023 1342   MCH 28.9 11/04/2023 1408   MCHC 32.9 11/04/2023 1408   RDW 17.2 (H) 11/04/2023 1408   RDW 15.9 (H) 05/19/2023 1342   LYMPHSABS 2.1 11/04/2023 1408   LYMPHSABS 2.0 05/19/2023 1342   MONOABS 0.9 11/04/2023 1408   EOSABS 0.2 11/04/2023 1408   EOSABS 0.1 05/19/2023 1342   BASOSABS 0.1 11/04/2023 1408   BASOSABS 0.1 05/19/2023 1342     CMP     Component Value Date/Time   NA 140 11/02/2023 0908   K 4.1 11/02/2023 0908   CL 98 11/02/2023 0908   CO2 24 11/02/2023 0908   GLUCOSE 119 (H) 11/02/2023 0908   GLUCOSE 96 07/10/2023 1521   BUN 12 11/02/2023 0908   CREATININE 0.91  11/02/2023 0908   CREATININE 0.89 01/27/2014 0914   CALCIUM 10.3 (H) 11/02/2023 0908   PROT 7.5 11/02/2023 0908   ALBUMIN 4.7 11/02/2023 0908   AST 27 11/02/2023 0908   ALT 26 11/02/2023 0908   ALKPHOS 55 11/02/2023 0908   BILITOT 0.3 11/02/2023 0908   GFRNONAA >60 07/10/2023 1521   GFRAA 76 08/10/2020 0905     ASSESSMENT & PLAN: 58 year old female   Iron deficiency anemia, 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 She does not need IV iron at this time -Given that ferritin remains on the low-normal side, I recommend to switch to pre-natal vitamin for additional 3 months if she can tolerate, then repeat labs -She does not plan to donate blood again, which I agree -Phone visit in 3 months, about a week after lab. If IDA remains resolved at that point, she does not need to see [place] routinely.  -PCP visit in 04/2024 as scheduled   PLAN: -Labs reviewed, IDA resolved -No need for IV iron at this time -Switch to pre-natal vitamin daily x3 months -Lab in 3 months, phone visit a week later -If no recurrent IDA at that time, can f/up with PCP in the future   I discussed the assessment and treatment plan with the patient. The patient was provided an opportunity to ask questions and all were answered. The patient agreed with the plan and demonstrated an understanding of the instructions.   The patient was advised to call back or seek an in-person evaluation if the symptoms worsen or if the condition fails to improve as anticipated. No barriers to learning were detected. I spent 9 minutes counseling the patient face to face. The total time spent in the appointment was 15 minutes and more than 50% was on counseling,  review of test results, and coordination of care.   Santiago Glad, NP-C 11/10/2023

## 2023-11-11 ENCOUNTER — Inpatient Hospital Stay: Payer: 59 | Admitting: Hematology

## 2023-11-12 ENCOUNTER — Telehealth: Payer: Self-pay | Admitting: Nurse Practitioner

## 2023-11-12 NOTE — Telephone Encounter (Signed)
Patient is aware of scheduled appointment times/dates

## 2023-12-09 ENCOUNTER — Ambulatory Visit: Payer: 59 | Admitting: Family Medicine

## 2024-01-06 ENCOUNTER — Encounter: Payer: Self-pay | Admitting: Family Medicine

## 2024-01-18 ENCOUNTER — Other Ambulatory Visit: Payer: Self-pay | Admitting: Family Medicine

## 2024-01-18 ENCOUNTER — Encounter: Payer: Self-pay | Admitting: Family Medicine

## 2024-01-18 MED ORDER — ALBUTEROL SULFATE HFA 108 (90 BASE) MCG/ACT IN AERS: INHALATION_SPRAY | RESPIRATORY_TRACT | 5 refills | Status: AC

## 2024-01-21 ENCOUNTER — Ambulatory Visit: Admitting: Nurse Practitioner

## 2024-01-21 VITALS — BP 131/83 | HR 70 | Temp 99.1°F | Ht 59.0 in | Wt 179.2 lb

## 2024-01-21 DIAGNOSIS — B9689 Other specified bacterial agents as the cause of diseases classified elsewhere: Secondary | ICD-10-CM

## 2024-01-21 DIAGNOSIS — J4521 Mild intermittent asthma with (acute) exacerbation: Secondary | ICD-10-CM | POA: Diagnosis not present

## 2024-01-21 DIAGNOSIS — J208 Acute bronchitis due to other specified organisms: Secondary | ICD-10-CM

## 2024-01-21 MED ORDER — AZITHROMYCIN 250 MG PO TABS: ORAL_TABLET | ORAL | 0 refills | Status: DC

## 2024-01-21 MED ORDER — PREDNISONE 20 MG PO TABS: ORAL_TABLET | ORAL | 0 refills | Status: DC

## 2024-01-22 ENCOUNTER — Encounter: Payer: Self-pay | Admitting: Nurse Practitioner

## 2024-01-22 NOTE — Progress Notes (Signed)
   Subjective:    Patient ID: Courtney Hunter, female    DOB: Jan 27, 1966, 58 y.o.   MRN: [id number]  HPI Presents for complaints of persistent cough and congestion after a viral illness about 5 weeks ago.  Patient states she was improving until this week.  About 5 days ago cough became more persistent.  3 days ago began having crackling feeling in the chest.  And yesterday produced gold colored mucus.  Has checked her oxygen at home which has been normal.  Normally does not have to use her albuterol , has had to restart this due to wheezing, using at least 4 times a day.  No fever.  No chest tightness.  Mild chest wall pain due to coughing.  Slight shortness of breath especially with coughing.  Slight sore throat.  No ear pain.  Has been sitting up to sleep due to congestion and cough.  At this point minimal relief with NyQuil, Mucinex DM or Robitussin. Social History   Tobacco Use   Smoking status: Never   Smokeless tobacco: Never  Vaping Use   Vaping status: Never Used  Substance Use Topics   Alcohol use: Never   Drug use: Never        Objective:   Physical Exam NAD.  Alert, oriented.  Frequent spasmodic nonproductive cough.  TMs clear effusion, no erythema.  Pharynx nonerythematous with clear PND noted.  Neck supple with mild soft anterior adenopathy.  Lungs faint inspiratory and expiratory wheezes noted throughout lung fields.  Also faint expiratory crackles especially posterior.  No tachypnea.  Heart regular rate rhythm. Today's Vitals   01/21/24 1544  BP: 131/83  Pulse: 70  Temp: 99.1 F (37.3 C)  SpO2: 99%  Weight: 179 lb 3.2 oz (81.3 kg)  Height: 4\' 11"  (1.499 m)   Body mass index is 36.19 kg/m.        Assessment & Plan:   Problem List Items Addressed This Visit       Respiratory   ASTHMA   Relevant Medications   predniSONE  (DELTASONE ) 20 MG tablet   Other Visit Diagnoses       Acute bacterial bronchitis    -  Primary   Relevant Medications   azithromycin   (ZITHROMAX  Z-PAK) 250 MG tablet      Meds ordered this encounter  Medications   azithromycin  (ZITHROMAX  Z-PAK) 250 MG tablet    Sig: Take 2 tablets (500 mg) on  Day 1,  followed by 1 tablet (250 mg) once daily on Days 2 through 5.    Dispense:  6 each    Refill:  0    Supervising Provider:   Charlotta Cook A [[id number]]   predniSONE  (DELTASONE ) 20 MG tablet    Sig: Take 2 tabs po once a day for 5 days    Dispense:  10 tablet    Refill:  0    Supervising Provider:   Charlotta Cook A [[id number]]   Start Z-Pak and course of prednisone  as directed.  Continue albuterol  as directed.  Call back at the end of the course of steroids, may need a steroid inhaler for short time if continued wheezing/cough.  Patient to go to ED or urgent care in the meantime if symptoms worsen.  Warning signs reviewed.

## 2024-01-24 ENCOUNTER — Encounter: Payer: Self-pay | Admitting: Nurse Practitioner

## 2024-01-25 ENCOUNTER — Other Ambulatory Visit: Payer: Self-pay | Admitting: Nurse Practitioner

## 2024-01-25 ENCOUNTER — Other Ambulatory Visit (HOSPITAL_COMMUNITY): Payer: Self-pay

## 2024-01-25 MED ORDER — FLUTICASONE PROPIONATE HFA 110 MCG/ACT IN AERO: 2.0000 | INHALATION_SPRAY | Freq: Two times a day (BID) | RESPIRATORY_TRACT | 2 refills | Status: DC

## 2024-01-26 ENCOUNTER — Telehealth: Payer: Self-pay | Admitting: Pharmacy Technician

## 2024-01-26 ENCOUNTER — Other Ambulatory Visit: Payer: Self-pay | Admitting: Nurse Practitioner

## 2024-01-26 ENCOUNTER — Encounter: Payer: Self-pay | Admitting: Nurse Practitioner

## 2024-01-26 MED ORDER — QVAR REDIHALER 80 MCG/ACT IN AERB
2.0000 | INHALATION_SPRAY | Freq: Two times a day (BID) | RESPIRATORY_TRACT | 2 refills | Status: DC
Start: 2024-01-26 — End: 2024-04-08

## 2024-01-26 NOTE — Telephone Encounter (Signed)
 Pharmacy Patient Advocate Encounter   Received notification from CoverMyMeds that prior authorization for Fluticasone  Propionate HFA 110MCG/ACT aerosol is required/requested.   Insurance verification completed.   The patient is insured through [facility] .   Per test claim:  QVAR is preferred by the insurance.  If suggested medication is appropriate, Please send in a new RX and discontinue this one. If not, please advise as to why it's not appropriate so that we may request a Prior Authorization. Please note, some preferred medications may still require a PA.  If the suggested medications have not been trialed and there are no contraindications to their use, the PA will not be submitted, as it will not be approved.

## 2024-01-27 NOTE — Telephone Encounter (Signed)
 Med changed to QVAR on 01/26/2024.

## 2024-02-03 ENCOUNTER — Inpatient Hospital Stay: Payer: 59 | Attending: Hematology

## 2024-02-03 ENCOUNTER — Other Ambulatory Visit: Payer: Self-pay

## 2024-02-03 DIAGNOSIS — D509 Iron deficiency anemia, unspecified: Secondary | ICD-10-CM | POA: Insufficient documentation

## 2024-02-03 DIAGNOSIS — D500 Iron deficiency anemia secondary to blood loss (chronic): Secondary | ICD-10-CM

## 2024-02-03 LAB — CBC WITH DIFFERENTIAL/PLATELET
Abs Immature Granulocytes: 0.01 10*3/uL (ref 0.00–0.07)
Basophils Absolute: 0.1 10*3/uL (ref 0.0–0.1)
Basophils Relative: 2 %
Eosinophils Absolute: 0.8 10*3/uL — ABNORMAL HIGH (ref 0.0–0.5)
Eosinophils Relative: 10 %
HCT: 36.5 % (ref 36.0–46.0)
Hemoglobin: 12.7 g/dL (ref 12.0–15.0)
Immature Granulocytes: 0 %
Lymphocytes Relative: 26 %
Lymphs Abs: 2.1 10*3/uL (ref 0.7–4.0)
MCH: 31.1 pg (ref 26.0–34.0)
MCHC: 34.8 g/dL (ref 30.0–36.0)
MCV: 89.5 fL (ref 80.0–100.0)
Monocytes Absolute: 0.7 10*3/uL (ref 0.1–1.0)
Monocytes Relative: 9 %
Neutro Abs: 4.2 10*3/uL (ref 1.7–7.7)
Neutrophils Relative %: 53 %
Platelets: 287 10*3/uL (ref 150–400)
RBC: 4.08 MIL/uL (ref 3.87–5.11)
RDW: 13.5 % (ref 11.5–15.5)
WBC: 7.9 10*3/uL (ref 4.0–10.5)
nRBC: 0 % (ref 0.0–0.2)

## 2024-02-04 NOTE — Telephone Encounter (Signed)
 I have called mother and got the email address for shot records to send via email.

## 2024-02-08 ENCOUNTER — Other Ambulatory Visit: Payer: Self-pay | Admitting: Family Medicine

## 2024-02-09 DIAGNOSIS — D500 Iron deficiency anemia secondary to blood loss (chronic): Secondary | ICD-10-CM | POA: Insufficient documentation

## 2024-02-09 NOTE — Progress Notes (Unsigned)
 I connected with Courtney Hunter on 02/10/24 at  8:30 AM EDT by telephone and verified that I am speaking with the correct person using two identifiers.   I discussed the limitations, risks, security and privacy concerns of performing an evaluation and management service by telemedicine and the availability of in-person appointments. I also discussed with the patient that there may be a patient responsible charge related to this service. The patient expressed understanding and agreed to proceed.   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 She does not need IV iron at this time -switched to pre-natal vitamin for additional 3 months if she can tolerate, as ferritin has remained low/normal.  -Repeat CBC on 02/03/2024 showed continued resolution of IDA with Hgb 12.7 and HCT 36.5. -She does not plan to donate blood again.  -continue to take prenatal vitamin with iron everyday.  -due to persistent fatigue, will repeat CBC and iron studies in 6 weeks and follow up with her over telephone to discuss results.  --if she maintains resolution of IDA, she can change to as needed visits and follow up with primary care.  -PCP visit in 04/2024 as scheduled   Fatigue This may be due to  resolving IDA. She also reports recent respiratory infection (she thinks may have been COVID) which started in March. She had to have antibiotics and steroid inhaler. She has persistent cough, especially in the mornings. Continues to need rescue inhaler.   Plan: Reviewed CBC -continues to show resolution of anemia with slight elevation of absolute eosinophils.  Due to persistent fatigue, will recheck CBC along with iron studies and ferritin level in 6 weeks.  Follow up with telephone visit 1 week after labs to review.  If she is maintaining resolution of IDA, she can see us  back on PRN basis.  Follow up with PCP as scheduled .  The patient understands the plans discussed today and is in agreement with them.  She knows to contact our office if she develops concerns prior to her next appointment.  I provided 10 minutes of face-to-face time during this encounter and > 50% was spent counseling as documented under my assessment and plan.    [name], NP  [facility] - A DEPT OF [facility] [address] [place] [address] Dept: [phone] Dept Fax: [phone]   No orders of the defined types were placed in this encounter.     CHIEF COMPLAINT:  CC: Iron deficiency anemia due to chronic blood loss  Current Treatment: Oral iron  INTERVAL HISTORY:  Pretty is here today for repeat clinical assessment.  Last telephone visit was 11/10/2023 with Lacie, NP.  She is taking oral iron every other day.  She was  switched to prenatal vitamin with iron due to low-normal ferritin level despite resolution of IDA.  Recheck of CBC shows continued resolution of IDA.  Hgb 12.7 and HCT 36.5.  Ferritin level was not drawn. States that she is taking her prenatal vitamin every day. Has not missed any doses. Does not have any negative side effects from this. Denies constipation, stomach upset, or dark colored stools. She continues to  feel fatigued. Did have URI which started in March. Continue to have cough, especially in the mornings. She believes this may be contributing to her fatigue. She denies chest pain, chest pressure, or shortness of breath. She denies headaches or visual disturbances. She denies abdominal pain, nausea, vomiting, or changes in bowel or bladder habits.    She denies fevers or chills. She denies pain. Her appetite is good.  I have reviewed the past medical history, past surgical history, social history and family history with the patient and they are unchanged from previous note.  ALLERGIES:  is allergic to elemental sulfur, sulfa antibiotics, voltaren [diclofenac sodium], and ozempic  (0.25 or 0.5 mg-dose) [semaglutide (0.25 or 0.5mg -dos)].  MEDICATIONS:  Current Outpatient Medications  Medication Sig Dispense Refill   beclomethasone (QVAR  REDIHALER) 80 MCG/ACT inhaler Inhale 2 puffs into the lungs 2 (two) times daily. 1 each 2   albuterol  (VENTOLIN  HFA) 108 (90 Base) MCG/ACT inhaler USE 2 PUFFS INTO LUNGS EVERY 4 HOURS AS NEEDED FOR WHEEZING OR SHORTNESS OF BREATH 18 g 5   ALPRAZolam  (XANAX ) 1 MG tablet TAKE ONE TABLET (1 MG TOTAL) BY MOUTH ATBEDTIME AS NEEDED FOR SLEEP. 30 tablet 5   azithromycin  (ZITHROMAX  Z-PAK) 250 MG tablet Take 2 tablets (500 mg) on  Day 1,  followed by 1 tablet (250 mg) once daily on Days 2 through 5. 6 each 0   Cyanocobalamin  (VITAMIN B 12 PO) Take by mouth.     glucosamine-chondroitin 500-400 MG tablet Take 1 tablet by mouth 3 (three) times daily.     hydrochlorothiazide  (HYDRODIURIL ) 25 MG tablet TAKE 1/2 TO 1 TABLET BY MOUTH EVERY MORNING AS NEEDED FOR EDEMA 90 tablet 1   lisinopril  (ZESTRIL ) 10 MG tablet 1 qam 90 tablet 1   metFORMIN  (GLUCOPHAGE ) 500 MG tablet TAKE ONE (1) TABLET BY MOUTH EVERY DAY WITH A MEAL 90 tablet 1   omeprazole  (PRILOSEC) 20 MG capsule 1 qd 90 capsule 3   OVER THE COUNTER MEDICATION Iron 325 eod     predniSONE  (DELTASONE ) 20 MG tablet Take 2 tabs po  once a day for 5 days 10 tablet 0   rosuvastatin  (CRESTOR ) 20 MG tablet Take 1 tablet (20 mg total) by mouth daily. 90 tablet 1   sertraline  (ZOLOFT ) 100 MG tablet Take 1.5 tablets (150 mg total) by mouth daily. 135 tablet 1   No current facility-administered medications for this visit.    Review of symptoms Constitutional: Denies fevers, chills or abnormal weight loss. Has persistent fatigue Eyes: Denies blurriness of vision Ears, nose, mouth, throat, and face: Denies mucositis or sore throat Respiratory: Denies dyspnea or wheezes. Has cough which started during respiratory infection in March. Cardiovascular: Denies palpitation, chest discomfort or lower extremity swelling Gastrointestinal:  Denies nausea, heartburn or change in bowel habits Skin: Denies abnormal skin rashes Lymphatics: Denies new lymphadenopathy or easy bruising Neurological:Denies numbness, tingling or new weaknesses Behavioral/Psych: Mood is stable, no new changes  All other systems were reviewed with the patient and are negative.   VITALS:   There were no vitals taken for this visit.  Wt Readings from Last 3 Encounters:  01/21/24 179 lb 3.2 oz (81.3 kg)  11/04/23 178 lb (80.7 kg)  08/22/23 175 lb 12.8 oz (79.7 kg)    There is no height or weight on file to calculate BMI.  Performance status (ECOG): 1 - Symptomatic but completely ambulatory  PHYSICAL EXAM:   No physical exam due to telephone visit.   LABORATORY DATA:  I have reviewed the data as listed    Component Value Date/Time   NA 140 11/02/2023 0908   K 4.1 11/02/2023 0908   CL 98 11/02/2023 0908   CO2 24 11/02/2023 0908   GLUCOSE 119 (H) 11/02/2023 0908   GLUCOSE 96 07/10/2023 1521   BUN 12 11/02/2023 0908   CREATININE 0.91 11/02/2023 0908   CREATININE 0.89 01/27/2014 0914   CALCIUM  10.3 (H) 11/02/2023 0908   PROT 7.5 11/02/2023 0908   ALBUMIN 4.7 11/02/2023 0908   AST 27 11/02/2023 0908   ALT 26 11/02/2023 0908   ALKPHOS 55  11/02/2023 0908   BILITOT 0.3 11/02/2023 0908   GFRNONAA >60 07/10/2023 1521   GFRAA 76 08/10/2020 0905    Lab Results  Component Value Date   WBC 7.9 02/03/2024   NEUTROABS 4.2 02/03/2024   HGB 12.7 02/03/2024   HCT 36.5 02/03/2024   MCV 89.5 02/03/2024   PLT 287 02/03/2024

## 2024-02-09 NOTE — Assessment & Plan Note (Signed)
,   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 She does not need IV iron at this time -switched to pre-natal vitamin for additional 3 months if she can tolerate, as ferritin has remained low/normal.  -Repeat CBC on 02/03/2024 showed continued resolution of IDA with Hgb 12.7 and HCT 36.5. -She does not plan to donate blood again.  -continue to take prenatal vitamin with iron everyday.  -due to persistent fatigue, will repeat CBC and iron studies in 6 weeks and follow up with her over telephone to discuss results.  --if she maintains resolution of IDA, she can change to as needed visits and follow up with primary care.  -PCP visit in 04/2024 as scheduled

## 2024-02-10 ENCOUNTER — Encounter: Payer: Self-pay | Admitting: Nurse Practitioner

## 2024-02-10 ENCOUNTER — Inpatient Hospital Stay: Payer: 59 | Admitting: Nurse Practitioner

## 2024-02-10 DIAGNOSIS — D500 Iron deficiency anemia secondary to blood loss (chronic): Secondary | ICD-10-CM | POA: Diagnosis not present

## 2024-02-15 ENCOUNTER — Encounter: Payer: Self-pay | Admitting: Nurse Practitioner

## 2024-02-16 ENCOUNTER — Ambulatory Visit (HOSPITAL_COMMUNITY)
Admission: RE | Admit: 2024-02-16 | Discharge: 2024-02-16 | Disposition: A | Discharge status: Home / Self Care | Source: Ambulatory Visit | Attending: Nurse Practitioner | Admitting: Nurse Practitioner

## 2024-02-16 ENCOUNTER — Other Ambulatory Visit: Payer: Self-pay | Admitting: Nurse Practitioner

## 2024-02-16 DIAGNOSIS — R053 Chronic cough: Secondary | ICD-10-CM | POA: Insufficient documentation

## 2024-02-16 MED ORDER — LEVOFLOXACIN 500 MG PO TABS: 500.0000 mg | ORAL_TABLET | Freq: Every day | ORAL | 0 refills | Status: AC

## 2024-02-16 MED ORDER — PANTOPRAZOLE SODIUM 40 MG PO TBEC: 40.0000 mg | DELAYED_RELEASE_TABLET | Freq: Every day | ORAL | 2 refills | Status: DC

## 2024-02-18 ENCOUNTER — Ambulatory Visit: Payer: Self-pay | Admitting: Nurse Practitioner

## 2024-03-24 ENCOUNTER — Inpatient Hospital Stay: Attending: Hematology

## 2024-03-24 DIAGNOSIS — D500 Iron deficiency anemia secondary to blood loss (chronic): Secondary | ICD-10-CM

## 2024-03-24 DIAGNOSIS — D509 Iron deficiency anemia, unspecified: Secondary | ICD-10-CM | POA: Insufficient documentation

## 2024-03-24 LAB — CBC WITH DIFFERENTIAL/PLATELET
Abs Immature Granulocytes: 0 10*3/uL (ref 0.00–0.07)
Basophils Absolute: 0.1 10*3/uL (ref 0.0–0.1)
Basophils Relative: 1 %
Eosinophils Absolute: 0.5 10*3/uL (ref 0.0–0.5)
Eosinophils Relative: 8 %
HCT: 34.6 % — ABNORMAL LOW (ref 36.0–46.0)
Hemoglobin: 12.1 g/dL (ref 12.0–15.0)
Immature Granulocytes: 0 %
Lymphocytes Relative: 29 %
Lymphs Abs: 1.6 10*3/uL (ref 0.7–4.0)
MCH: 31.6 pg (ref 26.0–34.0)
MCHC: 35 g/dL (ref 30.0–36.0)
MCV: 90.3 fL (ref 80.0–100.0)
Monocytes Absolute: 0.7 10*3/uL (ref 0.1–1.0)
Monocytes Relative: 12 %
Neutro Abs: 2.9 10*3/uL (ref 1.7–7.7)
Neutrophils Relative %: 50 %
Platelets: 243 10*3/uL (ref 150–400)
RBC: 3.83 MIL/uL — ABNORMAL LOW (ref 3.87–5.11)
RDW: 13 % (ref 11.5–15.5)
WBC: 5.8 10*3/uL (ref 4.0–10.5)
nRBC: 0 % (ref 0.0–0.2)

## 2024-03-25 LAB — FERRITIN: Ferritin: 22 ng/mL (ref 11–307)

## 2024-03-31 ENCOUNTER — Inpatient Hospital Stay: Attending: Hematology | Admitting: Nurse Practitioner

## 2024-03-31 DIAGNOSIS — D500 Iron deficiency anemia secondary to blood loss (chronic): Secondary | ICD-10-CM

## 2024-03-31 NOTE — Progress Notes (Signed)
 Patient Care Team: Courtney Glendia LABOR, MD as PCP - General (Family Medicine)  Clinic Day:  03/31/2024  Referring physician: Alphonsa Glendia LABOR, MD  I connected with Courtney Hunter on 04/03/24 at  8:30 AM EDT by @VIRTUALVISITMETHODCHOICE @ and verified that I am speaking with the correct person using two identifiers.   I discussed the limitations, risks, security and privacy concerns of performing an evaluation and management service by telemedicine and the availability of in-person appointments. I also discussed with the patient that there may be a patient responsible charge related to this service. The patient expressed understanding and agreed to proceed.   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 She does not need IV iron at this time -switched to pre-natal vitamin for additional 3 months if she can tolerate, as ferritin has remained low/normal.  -Repeat CBC on 02/03/2024 showed continued resolution of IDA with Hgb 12.7 and HCT 36.5. -She does not plan to donate blood again.  -continue to take prenatal vitamin with iron everyday.  -due to persistent fatigue, will repeat CBC and iron studies in 6 weeks and follow up with her over telephone to discuss results.  -03/24/2024.  She is maintaining normal Hgb at 12.1.  HCT slightly low at 34.6.  MCV is normal at 90.3.  Her ferritin is low/normal at 22.  This is improved from check 4  months ago when ferritin was 12. - As she has maintained resolution of IDA, she can change to as needed visits and follow up with primary care.  -PCP visit in 04/2024 as scheduled.  Will ask that they check CBC, iron study, and thyroid panel during interim visit.   Plan Reviewed labs. -CBC showing Hgb 12.1 and HCT 39.6. - Ferritin is 22 Continue oral iron every day. Plan to recheck labs in 3 months and follow-up 1 week later to review. Asked that primary care recheck labs in August 2025 copy of progress note today.   The patient understands the plans discussed today and is in agreement with them.  She knows to contact our office if she develops concerns prior to her next appointment.  I provided 10 minutes of face-to-face time during this encounter and > 50% was spent counseling as documented under my assessment and plan.    Courtney FORBES Lessen, NP  [facility] - A DEPT OF [facility] [address] [place] [address] Dept: [phone] Dept Fax: [phone]   No orders of the defined types were placed in this encounter.     CHIEF COMPLAINT:  CC: Iron deficiency anemia due to chronic blood loss  Current Treatment: oral iron  INTERVAL HISTORY:  Courtney Hunter is here today for repeat clinical assessment.  She had telephone visit with me on 02/10/2024.  Iron deficiency anemia had resolved while taking oral iron supplement.  She had blood work done on 03/24/2024.  She is maintaining normal Hgb  at 12.1.  HCT slightly low at 34.6.  MCV is normal at 90.3.  Her ferritin is low/normal at 22.  This is improved from check 4 months ago when ferritin was 12.  She reports feeling very fatigued.  Has started to see spots in peripheral vision again.  Sometimes, she does feel lightheaded.  She denies chest pain, chest pressure, or shortness of breath. She denies headaches or visual disturbances. She denies abdominal pain, nausea, vomiting,  or changes in bowel or bladder habits.  She denies unusual bleeding such as blood in her stool, hematemesis, epistaxis, or hemoptysis.  She denies fevers or chills. She denies pain. Her appetite is good.   I have reviewed the past medical history, past surgical history, social history and family history with the patient and they are unchanged from previous note.  ALLERGIES:  is allergic to elemental sulfur, sulfa antibiotics, voltaren [diclofenac sodium], and ozempic  (0.25 or 0.5 mg-dose) [semaglutide (0.25 or 0.5mg -dos)].  MEDICATIONS:  Current Outpatient Medications  Medication Sig Dispense Refill   beclomethasone (QVAR  REDIHALER) 80 MCG/ACT inhaler Inhale 2 puffs into the lungs 2 (two) times daily. 1 each 2   pantoprazole  (PROTONIX ) 40 MG tablet Take 1 tablet (40 mg total) by mouth daily. 30 tablet 2   albuterol  (VENTOLIN  HFA) 108 (90 Base) MCG/ACT inhaler USE 2 PUFFS INTO LUNGS EVERY 4 HOURS AS NEEDED FOR WHEEZING OR SHORTNESS OF BREATH 18 g 5   ALPRAZolam  (XANAX ) 1 MG tablet TAKE ONE TABLET (1 MG TOTAL) BY MOUTH ATBEDTIME AS NEEDED FOR SLEEP. 30 tablet 5   azithromycin  (ZITHROMAX  Z-PAK) 250 MG tablet Take 2 tablets (500 mg) on  Day 1,  followed by 1 tablet (250 mg) once daily on Days 2 through 5. 6 each 0   Cyanocobalamin  (VITAMIN B 12 PO) Take by mouth.     glucosamine-chondroitin 500-400 MG tablet Take 1 tablet by mouth 3 (three) times daily.     hydrochlorothiazide  (HYDRODIURIL ) 25 MG tablet TAKE 1/2 TO 1 TABLET BY MOUTH EVERY MORNING AS NEEDED FOR EDEMA 90 tablet 1   lisinopril  (ZESTRIL ) 10 MG tablet 1 qam 90 tablet 1   metFORMIN  (GLUCOPHAGE ) 500 MG tablet TAKE ONE (1) TABLET BY MOUTH EVERY DAY WITH A MEAL 90 tablet 1   OVER THE COUNTER MEDICATION Iron 325 eod     predniSONE  (DELTASONE ) 20 MG tablet Take 2 tabs po once a day for 5 days 10 tablet 0   rosuvastatin  (CRESTOR ) 20 MG tablet Take 1 tablet (20 mg total) by mouth daily. 90 tablet 1   sertraline  (ZOLOFT ) 100 MG tablet Take 1.5  tablets (150 mg total) by mouth daily. 135 tablet 1   No current facility-administered medications for this visit.    REVIEW OF SYSTEMS:   Constitutional: Denies fevers, chills or abnormal weight loss. Increased fatigue.  Eyes: Denies blurriness of vision Ears, nose, mouth, throat, and face: Denies mucositis or sore throat Respiratory: Denies cough, dyspnea or wheezes Cardiovascular: Denies palpitation, chest discomfort or lower extremity swelling Gastrointestinal:  Denies nausea, heartburn or change in bowel habits Skin: Denies abnormal skin rashes Lymphatics: Denies new lymphadenopathy or easy bruising Neurological:Denies numbness, tingling or new weaknesses Behavioral/Psych: Mood is stable, no new changes  All other systems were reviewed with the patient and are negative.    Wt Readings from Last 3 Encounters:  01/21/24 179 lb 3.2 oz (81.3 kg)  11/04/23 178 lb (80.7 kg)  08/22/23 175 lb 12.8 oz (79.7 kg)    There is no height  or weight on file to calculate BMI.  Performance status (ECOG): 1 - Symptomatic but completely ambulatory  LABORATORY DATA:  I have reviewed the data as listed    Component Value Date/Time   NA 140 11/02/2023 0908   K 4.1 11/02/2023 0908   CL 98 11/02/2023 0908   CO2 24 11/02/2023 0908   GLUCOSE 119 (H) 11/02/2023 0908   GLUCOSE 96 07/10/2023 1521   BUN 12 11/02/2023 0908   CREATININE 0.91 11/02/2023 0908   CREATININE 0.89 01/27/2014 0914   CALCIUM  10.3 (H) 11/02/2023 0908   PROT 7.5 11/02/2023 0908   ALBUMIN 4.7 11/02/2023 0908   AST 27 11/02/2023 0908   ALT 26 11/02/2023 0908   ALKPHOS 55 11/02/2023 0908   BILITOT 0.3 11/02/2023 0908   GFRNONAA >60 07/10/2023 1521   GFRAA 76 08/10/2020 0905    Lab Results  Component Value Date   WBC 5.8 03/24/2024   NEUTROABS 2.9 03/24/2024   HGB 12.1 03/24/2024   HCT 34.6 (L) 03/24/2024   MCV 90.3 03/24/2024   PLT 243 03/24/2024

## 2024-03-31 NOTE — Assessment & Plan Note (Addendum)
,   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 She does not need IV iron at this time -switched to pre-natal vitamin for additional 3 months if she can tolerate, as ferritin has remained low/normal.  -Repeat CBC on 02/03/2024 showed continued resolution of IDA with Hgb 12.7 and HCT 36.5. -She does not plan to donate blood again.  -continue to take prenatal vitamin with iron everyday.  -due to persistent fatigue, will repeat CBC and iron studies in 6 weeks and follow up with her over telephone to discuss results.  -03/24/2024.  She is maintaining normal Hgb at 12.1.  HCT slightly low at 34.6.  MCV is normal at 90.3.  Her ferritin is low/normal at 22.  This is improved from check 4 months ago when ferritin was 12. - As she has maintained resolution of IDA, she can change to as needed visits and follow up with primary care.  -PCP visit in 04/2024 as scheduled.  Will ask that they check CBC, iron study, and thyroid panel during interim visit.

## 2024-04-03 ENCOUNTER — Encounter: Payer: Self-pay | Admitting: Nurse Practitioner

## 2024-04-04 ENCOUNTER — Telehealth: Payer: Self-pay | Admitting: Nurse Practitioner

## 2024-04-04 NOTE — Telephone Encounter (Signed)
 Scheduled appointments per 7/3 los. Talked with the patient and she is aware of the made appointments.

## 2024-04-06 ENCOUNTER — Encounter: Payer: Self-pay | Admitting: Nurse Practitioner

## 2024-04-07 NOTE — Telephone Encounter (Signed)
 Nurses She needs to be seen this week Continue breathing treatments and regular medications as well Please get her an appointment with me thank you

## 2024-04-08 ENCOUNTER — Other Ambulatory Visit: Payer: Self-pay | Admitting: Family Medicine

## 2024-04-08 ENCOUNTER — Telehealth: Payer: Self-pay | Admitting: Pharmacy Technician

## 2024-04-08 ENCOUNTER — Ambulatory Visit: Admitting: Family Medicine

## 2024-04-08 ENCOUNTER — Other Ambulatory Visit (HOSPITAL_COMMUNITY): Payer: Self-pay

## 2024-04-08 MED ORDER — FLUTICASONE-SALMETEROL 230-21 MCG/ACT IN AERO
2.0000 | INHALATION_SPRAY | Freq: Two times a day (BID) | RESPIRATORY_TRACT | 12 refills | Status: DC
Start: 2024-04-08 — End: 2024-04-08

## 2024-04-08 MED ORDER — ADVAIR HFA 230-21 MCG/ACT IN AERO
INHALATION_SPRAY | RESPIRATORY_TRACT | 12 refills | Status: AC
Start: 2024-04-08 — End: ?

## 2024-04-08 NOTE — Telephone Encounter (Signed)
 Pharmacy Patient Advocate Encounter   Received notification from CoverMyMeds that prior authorization for Fluticasone -Salmeterol 230-21MCG/ACT aerosol is required/requested.   Insurance verification completed.   The patient is insured through [facility] .   Per test claim:  BRAND NAME ADVAIR  HFA is preferred by the insurance.  If suggested medication is appropriate, Please send in a new RX and discontinue this one. If not, please advise as to why it's not appropriate so that we may request a Prior Authorization. Please note, some preferred medications may still require a PA.  If the suggested medications have not been trialed and there are no contraindications to their use, the PA will not be submitted, as it will not be approved.  Brand name Advair  HFA is covered, however copay is $105.00.  Arnuity is a preferred drug with a copay of $40.00.

## 2024-04-08 NOTE — Telephone Encounter (Signed)
 So we tried to send in brand-name hopefully that goes through

## 2024-04-25 ENCOUNTER — Encounter: Payer: Self-pay | Admitting: Family Medicine

## 2024-04-25 ENCOUNTER — Telehealth: Payer: Self-pay | Admitting: Family Medicine

## 2024-04-25 NOTE — Telephone Encounter (Signed)
 Nurses Please order a CBC, TIBC, ferritin, TSH, free T4, A1c, metabolic 7, lipid, liver  Diagnosis iron deficient anemia, other fatigue, prediabetes, weight gain, high risk medication, hypercalcemia, hyperlipidemia  Patient is aware please put in orders thank you

## 2024-04-26 ENCOUNTER — Other Ambulatory Visit: Payer: Self-pay

## 2024-04-26 DIAGNOSIS — R5383 Other fatigue: Secondary | ICD-10-CM

## 2024-04-26 DIAGNOSIS — Z79899 Other long term (current) drug therapy: Secondary | ICD-10-CM

## 2024-04-26 DIAGNOSIS — D509 Iron deficiency anemia, unspecified: Secondary | ICD-10-CM

## 2024-04-26 DIAGNOSIS — E785 Hyperlipidemia, unspecified: Secondary | ICD-10-CM

## 2024-04-26 DIAGNOSIS — I10 Essential (primary) hypertension: Secondary | ICD-10-CM

## 2024-04-26 DIAGNOSIS — R7303 Prediabetes: Secondary | ICD-10-CM

## 2024-05-03 ENCOUNTER — Encounter: Payer: Self-pay | Admitting: Family Medicine

## 2024-05-03 ENCOUNTER — Ambulatory Visit (INDEPENDENT_AMBULATORY_CARE_PROVIDER_SITE_OTHER): Admitting: Nurse Practitioner

## 2024-05-03 VITALS — BP 136/83 | HR 70 | Temp 98.0°F | Ht 59.0 in | Wt 181.0 lb

## 2024-05-03 DIAGNOSIS — R1032 Left lower quadrant pain: Secondary | ICD-10-CM

## 2024-05-03 MED ORDER — AMOXICILLIN-POT CLAVULANATE 875-125 MG PO TABS: 1.0000 | ORAL_TABLET | Freq: Two times a day (BID) | ORAL | 0 refills | Status: DC

## 2024-05-03 NOTE — Progress Notes (Signed)
 Subjective:    Patient ID: Courtney Hunter, female    DOB: [birth year], 58 y.o.   MRN: [id number]  HPI Left lower quadrant pain, constipation , last lg bm was today No fever, has been feeling various temp changes in her body cool sweats, chills Even after bm today still feeling knot in llq Drinking plenty and eating light  Colonscopy in the past year was ok Presents for complaints of left lower quadrant pain that began 2 days ago.  Her diet had changed significantly over the past several days, states her son was leaving to go out of town.  Of note patient states she has been eating a lot of nuts for the past several days.  Began having the discomfort and feeling bad 2 days ago.  Took MiraLAX the day before.  Had some small BMs but no relief of her symptoms.  Drinking plenty of fluids.  Yesterday again some small BMs but continued to have discomfort and not feeling well.  Last night temp 99.3 with chills and slight sweats at times.  No urinary symptoms.  Had a large BM today, has had slight relief.  No obvious blood in her stools.  No urinary symptoms.  Some nausea but no vomiting.  Today ate some soup and 1 egg as far as her meals. Review of Systems  Constitutional:  Positive for fatigue and fever.  Respiratory:  Negative for cough, chest tightness and shortness of breath.   Cardiovascular:  Negative for chest pain.  Gastrointestinal:  Positive for abdominal pain, constipation and nausea. Negative for blood in stool, diarrhea and vomiting.  Genitourinary:  Negative for dysuria.       No change in bladder habits.   Social History   Tobacco Use   Smoking status: Never   Smokeless tobacco: Never  Vaping Use   Vaping status: Never Used  Substance Use Topics   Alcohol use: Never   Drug use: Never        Objective:   Physical Exam Vitals and nursing note reviewed.  Constitutional:      General: She is not in acute distress. Cardiovascular:     Rate and Rhythm: Normal rate and  regular rhythm.  Pulmonary:     Effort: Pulmonary effort is normal.     Breath sounds: Normal breath sounds.  Abdominal:     General: Bowel sounds are normal. There is no distension.     Palpations: Abdomen is soft. There is no mass.     Tenderness: There is abdominal tenderness in the left lower quadrant. There is no guarding or rebound.  Neurological:     Mental Status: She is alert and oriented to person, place, and time.  Psychiatric:        Mood and Affect: Mood normal.        Behavior: Behavior normal.   Distinct tenderness in the left lower quadrant, no obvious masses. 10/16/2021: Report from colonoscopy shows no signs of diverticulosis. 01/08/2016: CT scan of the abdomen and pelvis showed no evidence of diverticulosis.  Today's Vitals   05/03/24 1550  BP: 136/83  Pulse: 70  Temp: 98 F (36.7 C)  SpO2: 97%  Weight: 181 lb (82.1 kg)  Height: 4' 11 (1.499 m)   Body mass index is 36.56 kg/m.      Assessment & Plan:   Problem List Items Addressed This Visit       Other   Left lower quadrant abdominal pain - Primary   Relevant Orders  CT ABDOMEN PELVIS W CONTRAST   Meds ordered this encounter  Medications   amoxicillin -clavulanate (AUGMENTIN ) 875-125 MG tablet    Sig: Take 1 tablet by mouth 2 (two) times daily.    Dispense:  14 tablet    Refill:  0    Supervising Provider:   ALPHONSA HAMILTON A [[id number]]   Discussed possibility of diverticulitis.  Start Augmentin  as directed this evening.  Due to the late hour a stat CT scan will be scheduled tomorrow since patient is stable on exam.  Warning signs reviewed.  Patient to go to ED tonight if any new or worsening symptoms.  Further follow-up based on test results.  Patient encouraged to get her labs done that were previously ordered.  Also reschedule wellness exam with Dr. HAMILTON.

## 2024-05-04 ENCOUNTER — Ambulatory Visit: Payer: 59 | Admitting: Family Medicine

## 2024-05-04 ENCOUNTER — Ambulatory Visit (HOSPITAL_COMMUNITY)
Admission: RE | Admit: 2024-05-04 | Discharge: 2024-05-04 | Disposition: A | Discharge status: Home / Self Care | Source: Ambulatory Visit | Attending: Nurse Practitioner | Admitting: Nurse Practitioner

## 2024-05-04 DIAGNOSIS — R1032 Left lower quadrant pain: Secondary | ICD-10-CM | POA: Diagnosis present

## 2024-05-04 MED ORDER — IOHEXOL 300 MG/ML  SOLN
100.0000 mL | Freq: Once | INTRAMUSCULAR | Status: AC | PRN
  Administered 2024-05-04: 100 mL via INTRAVENOUS

## 2024-05-04 MED ORDER — IOHEXOL 9 MG/ML PO SOLN
500.0000 mL | ORAL | Status: AC
  Administered 2024-05-04: 500 mL via ORAL

## 2024-05-05 ENCOUNTER — Ambulatory Visit: Payer: Self-pay | Admitting: Nurse Practitioner

## 2024-05-05 LAB — POCT I-STAT CREATININE: Creatinine, Ser: 0.9 mg/dL (ref 0.44–1.00)

## 2024-05-05 NOTE — Telephone Encounter (Signed)
 Nurses Elveria messaged me regarding Anab situation.  She requested that I look at the CAT scan.  I reviewed over the CAT scan.  I would recommend the following Please confirm with that [facility]  #1 consult her gastroenterologist [facility] 2.  In the situations I would continue the antibiotics 3.  She has a follow-up visit with me on August 25 important to keep 4.  Important for her to give us  update around Tuesday of next week how her abdominal symptoms are doing-she can send a MyChart message Tuesday or call-message may be more direct-sooner if having any severe issues 5.  These type of findings often are related to inflam matory conditions from infection but gastroenterology may decide to do a colonoscopy later this fall once her abdominal infection has calm down The likelihood of this being the cancer is low but it does need further looking into  Please do the above and talk with Lillian regarding this.

## 2024-05-06 ENCOUNTER — Ambulatory Visit: Payer: Self-pay

## 2024-05-06 NOTE — Telephone Encounter (Signed)
 FYI Only or Action Required?: Action required by provider: unsure if will go to ED, requesting feedback from PCP office since recently seen on 8/5 and given antibx but worsening pain.  Patient was last seen in primary care on 05/03/2024 by Hunter Courtney BROCKS, NP.  Called Nurse Triage reporting Pelvic Pain, Abdominal Pain, colitis on antibx, Diarrhea, Fever, cold sweat, clamminess some, low appetite, and Anxiety.  Symptoms began several days ago.  Interventions attempted: Prescription medications: antibx, Rest, hydration, or home remedies, Dietary changes, and Other: PCP appt and CT scan on Wednesday.  Symptoms are: rapidly worsening.  Triage Disposition: Go to ED Now (Notify PCP)  Patient/caregiver understands and will follow disposition?: Unsure      Copied from CRM [id number]. Topic: Clinical - Red Word Triage >> May 06, 2024 12:01 PM Courtney Hunter wrote: Red Word that prompted transfer to Nurse Triage: patient has pain lower left pelvic area, unable to stand or walk pain gets worse, sitting feels like theres a knot that hurts, laying down there is some relief.  Reason for Disposition  [1] SEVERE pain AND [2] age > 60 years  Answer Assessment - Initial Assessment Questions Courtney Hunter to P [facility] (supporting Courtney Hunter Mauro, NP)   05/06/24 10:32 AM I guess my question is, should I still be in this much pain?  Laying down I can get a little comfortable. Standing and walking around I'm hurting. I think more than I should still be at this point but never had this issue before. CT ABDOMEN PELVIS W CONTRAST Courtney Hunter to P [facility] (supporting Courtney Hunter Mauro, NP)    05/06/24  9:03 AM Good morning  Just updating...Courtney Hunter still having right much pain. Taking antibiotics as directed, not eating a lot (bland) but drinking plenty of water. Going to the bathroom small amounts.  Have stayed out of work last two days resting. I will call Monday if no change by then.  Have a nice  weekend.  Courtney Hunter CT ABDOMEN PELVIS W CONTRAST   1. LOCATION: Where does it hurt?      Left lower pelvic area, intestines, colitis 2. RADIATION: Does the pain shoot anywhere else? (e.g., chest, back)     denies 3. ONSET: When did the pain begin? (e.g., minutes, hours or days ago)      Seen on 8/5 for pain, CT scan said colitis, not getting better, had said may have to have re-scan On antibx, pain is not better Just a little scared going into the weekend 6. SEVERITY: How bad is the pain?  (e.g., Scale 1-10; mild, moderate, or severe)     When hurts 7-8/10 Feels like knot there like already had felt like, but like a painful spasm there, like someone sticks a screwdriver and turns it Hurting now more than guess I did on Wednesday when had this scan 8. CAUSE: What do you think is causing the stomach pain? (e.g., gallstones, recent abdominal surgery)     Colitis per CT scan but worsening 9. RELIEVING/AGGRAVATING FACTORS: What makes it better or worse? (e.g., antacids, bending or twisting motion, bowel movement)     Worse with standing/walking, laying down some relief 10. OTHER SYMPTOMS: Do you have any other symptoms? (e.g., back pain, diarrhea, fever, urination pain, vomiting)       When do go to bathroom, it's like watery nasty but am eating bland diet per PCP Not used bathroom today Not eating a whole lot Trying to take antibx with food  Low-grade fever 99 or 99.1 F Don't feel nauseated or hungry Sunday did have nausea Cold sweat every once in a while Not really chills, I don't know Feel like cold/pale/clammy skin some Tolerate pain don't like to take pain meds  Advised ED Don't like to do that but if it's necessary   Advised ED, especially if any worsening at all in next couple hours. Pt would prefer appt but willing to go to hospital if PCP recommends. Sending message to PCP for call back asap with further recommendations, attempted to call [employer] to alert to situation,  no answer.  Protocols used: Abdominal Pain - Female-A-AH

## 2024-05-11 ENCOUNTER — Encounter: Payer: Self-pay | Admitting: Family Medicine

## 2024-05-11 ENCOUNTER — Other Ambulatory Visit: Payer: Self-pay

## 2024-05-11 NOTE — Telephone Encounter (Signed)
 Nurses Several things  #1 lab work can take 1 to 2 days to come back so therefore I do not have any results today  #2-with her ongoing pain I would recommend a recheck I can work her in this afternoon 345-4 PM or Courtney Hunter has an opening on Friday  (Obviously if she starts having severe symptoms or worrisome additional symptoms such as vomiting or fever may have to go to the ER)  Also previously we have put in a consult for gastroenterology she has seen [facility]-does she have an appointment with them coming up?  If so when  Thanks-Dr. Glendia

## 2024-05-12 ENCOUNTER — Ambulatory Visit: Payer: Self-pay | Admitting: Family Medicine

## 2024-05-12 LAB — CBC WITH DIFFERENTIAL/PLATELET
Basophils Absolute: 0.1 x10E3/uL (ref 0.0–0.2)
Basos: 1 %
EOS (ABSOLUTE): 0.3 x10E3/uL (ref 0.0–0.4)
Eos: 6 %
Hematocrit: 40.5 % (ref 34.0–46.6)
Hemoglobin: 13.2 g/dL (ref 11.1–15.9)
Immature Grans (Abs): 0 x10E3/uL (ref 0.0–0.1)
Immature Granulocytes: 0 %
Lymphocytes Absolute: 1.7 x10E3/uL (ref 0.7–3.1)
Lymphs: 34 %
MCH: 31.7 pg (ref 26.6–33.0)
MCHC: 32.6 g/dL (ref 31.5–35.7)
MCV: 97 fL (ref 79–97)
Monocytes Absolute: 0.5 x10E3/uL (ref 0.1–0.9)
Monocytes: 11 %
Neutrophils Absolute: 2.4 x10E3/uL (ref 1.4–7.0)
Neutrophils: 48 %
Platelets: 257 x10E3/uL (ref 150–450)
RBC: 4.16 x10E6/uL (ref 3.77–5.28)
RDW: 12.5 % (ref 11.7–15.4)
WBC: 5 x10E3/uL (ref 3.4–10.8)

## 2024-05-12 LAB — LIPID PANEL
Chol/HDL Ratio: 2.9 ratio (ref 0.0–4.4)
Cholesterol, Total: 139 mg/dL (ref 100–199)
HDL: 48 mg/dL (ref >39)
LDL Chol Calc (NIH): 62 mg/dL (ref 0–99)
Triglycerides: 171 mg/dL — ABNORMAL HIGH (ref 0–149)
VLDL Cholesterol Cal: 29 mg/dL (ref 5–40)

## 2024-05-12 LAB — HEMOGLOBIN A1C
Est. average glucose Bld gHb Est-mCnc: 123 mg/dL
Hgb A1c MFr Bld: 5.9 % — ABNORMAL HIGH (ref 4.8–5.6)

## 2024-05-12 LAB — IRON,TIBC AND FERRITIN PANEL
Ferritin: 32 ng/mL (ref 15–150)
Iron Saturation: 32 % (ref 15–55)
Iron: 126 ug/dL (ref 27–159)
Total Iron Binding Capacity: 391 ug/dL (ref 250–450)
UIBC: 265 ug/dL (ref 131–425)

## 2024-05-12 LAB — BASIC METABOLIC PANEL WITH GFR
BUN/Creatinine Ratio: 15 (ref 9–23)
BUN: 12 mg/dL (ref 6–24)
CO2: 24 mmol/L (ref 20–29)
Calcium: 10.1 mg/dL (ref 8.7–10.2)
Chloride: 97 mmol/L (ref 96–106)
Creatinine, Ser: 0.8 mg/dL (ref 0.57–1.00)
Glucose: 115 mg/dL — ABNORMAL HIGH (ref 70–99)
Potassium: 3.7 mmol/L (ref 3.5–5.2)
Sodium: 136 mmol/L (ref 134–144)
eGFR: 85 mL/min/1.73 (ref >59)

## 2024-05-12 LAB — HEPATIC FUNCTION PANEL
ALT: 42 IU/L — ABNORMAL HIGH (ref 0–32)
AST: 38 IU/L (ref 0–40)
Albumin: 4.6 g/dL (ref 3.8–4.9)
Alkaline Phosphatase: 50 IU/L (ref 44–121)
Bilirubin Total: 0.4 mg/dL (ref 0.0–1.2)
Bilirubin, Direct: 0.15 mg/dL (ref 0.00–0.40)
Total Protein: 7.1 g/dL (ref 6.0–8.5)

## 2024-05-12 LAB — TSH+FREE T4
Free T4: 1.14 ng/dL (ref 0.82–1.77)
TSH: 6.09 u[IU]/mL — ABNORMAL HIGH (ref 0.450–4.500)

## 2024-05-13 ENCOUNTER — Ambulatory Visit: Admitting: Nurse Practitioner

## 2024-05-13 ENCOUNTER — Other Ambulatory Visit: Payer: Self-pay | Admitting: Family Medicine

## 2024-05-16 ENCOUNTER — Other Ambulatory Visit: Payer: Self-pay | Admitting: Nurse Practitioner

## 2024-05-16 ENCOUNTER — Other Ambulatory Visit: Payer: Self-pay

## 2024-05-16 ENCOUNTER — Other Ambulatory Visit: Payer: Self-pay | Admitting: Family Medicine

## 2024-05-16 ENCOUNTER — Encounter: Payer: Self-pay | Admitting: Family Medicine

## 2024-05-16 MED ORDER — ALPRAZOLAM 1 MG PO TABS
ORAL_TABLET | ORAL | 5 refills | Status: AC
Start: 2024-05-16 — End: ?

## 2024-05-16 MED ORDER — SERTRALINE HCL 100 MG PO TABS: 150.0000 mg | ORAL_TABLET | Freq: Every day | ORAL | 1 refills | Status: DC

## 2024-05-23 ENCOUNTER — Ambulatory Visit (INDEPENDENT_AMBULATORY_CARE_PROVIDER_SITE_OTHER): Admitting: Family Medicine

## 2024-05-23 VITALS — BP 130/82 | HR 74 | Temp 98.2°F | Ht 59.0 in | Wt 180.6 lb

## 2024-05-23 DIAGNOSIS — R1032 Left lower quadrant pain: Secondary | ICD-10-CM

## 2024-05-23 DIAGNOSIS — R7303 Prediabetes: Secondary | ICD-10-CM | POA: Diagnosis not present

## 2024-05-23 DIAGNOSIS — I10 Essential (primary) hypertension: Secondary | ICD-10-CM

## 2024-05-23 DIAGNOSIS — E785 Hyperlipidemia, unspecified: Secondary | ICD-10-CM | POA: Diagnosis not present

## 2024-05-23 MED ORDER — LISINOPRIL 10 MG PO TABS: ORAL_TABLET | ORAL | 1 refills | Status: AC

## 2024-05-23 MED ORDER — HYDROCHLOROTHIAZIDE 25 MG PO TABS: ORAL_TABLET | ORAL | 1 refills | Status: AC

## 2024-05-23 MED ORDER — ROSUVASTATIN CALCIUM 20 MG PO TABS: 20.0000 mg | ORAL_TABLET | Freq: Every day | ORAL | 1 refills | Status: AC

## 2024-05-23 MED ORDER — METFORMIN HCL 500 MG PO TABS: ORAL_TABLET | ORAL | 1 refills | Status: AC

## 2024-05-23 NOTE — Progress Notes (Signed)
 Courtney Hunter is a 58 year old female who presents with left lower abdominal pain.  She has experienced intermittent left lower abdominal pain since 2010, with a significant exacerbation a few weeks ago that caused her to miss work for two and a half days. The pain was severe, accompanied by sweating and a sensation of something being 'stuck', but without vomiting or bowel movement. She reported that she ate nuts on several occasions prior to her recent episode of pain. A colonoscopy in 2023 did not reveal any diverticula.  She has a history of low blood counts, with hemoglobin levels around 12.1, and is monitored at a cancer center with blood draws every three months. She has experienced blood in her stool in the past, but a camera test showed no abnormalities. She recently had a CT scan and was relieved that it showed something abnormal, as she felt this validated her symptoms. She is not currently seeing visible blood in her stool. She is scheduled to see a gastroenterologist in several weeks.  She feels like she might be retaining fluid, particularly in her hands and feet. She has been trying to improve her diet and physical activity, having recently received a walking pad. Her summer diet was not ideal, with irregular eating habits. Recent blood work showed a slight increase in liver enzymes, and her ferritin levels remain low. She has been experiencing these issues for over a year, following an incident where she fainted after giving blood. Her TSH levels have increased slightly, but her T4 levels remain normal.  She is currently on medications including sertraline  and alprazolam , and she experienced a delay in refills recently, which caused concern about missing doses. She emphasizes the importance of maintaining her medication regimen to avoid adverse effects. Past Medical History Copy/Paste - Iron deficiency anemia - Elevated liver enzymes, likely due to fatty liver - Subclinical  hypothyroidism - Left lower abdominal pain, etiology unclear  Medications  - Alprazolam  - Sertraline   Physical Exam VITALS: BP- 128/74 ABDOMEN: Tenderness in the left lower abdomen.  Lungs clear heart regular extremities no edema skin warm dry  Results LABS Hb: 12.1 Liver enzymes: Slight increase Bilirubin: Normal LDL: Below 70 Ferritin: Low TSH: Increased T4: Normal  RADIOLOGY CT scan: Thickening in the colon  DIAGNOSTIC Colonoscopy: No diverticula (2023) Assessment & Plan Left lower abdominal pain Chronic pain with recent exacerbation, associated with constipation and sweating. Previous colonoscopy negative for diverticula. Recent CT showed thickening without definitive diagnosis. Gastroenterology follow-up scheduled. - Order stool test for blood. - Communicate with gastroenterologist regarding further evaluation, including potential repeat CT scan or colonoscopy with biopsies.  Iron deficiency anemia Chronic anemia with borderline hemoglobin. Regular monitoring at cancer center. Previous stool tests showed blood, recent evaluations did not find source. Potential causes include blood loss or absorption issues. - Continue regular monitoring of blood counts at cancer center. - Consider retesting stool for blood to assess ongoing blood loss.  Fatty liver Slight increase in liver enzymes consistent with fatty liver. No significant changes in bilirubin or cholesterol. - Encourage healthy eating and regular physical activity. - Re-evaluate liver enzymes in six months.  Subclinical hypothyroidism Slight increase in TSH with normal T4, indicating subclinical hypothyroidism. - Monitor thyroid function tests in six months.  Depression Ongoing management with sertraline  and alprazolam . Recent refill issues due to scheduling conflicts. Emphasized importance of medication adherence. - Ensure timely refills of sertraline  and alprazolam . - Advise using MyChart for medication  refill requests to avoid delays.  Prediabetes watch diet continue current approach  Hypertension good control continue current approach  Once stool test for blood comes back reconnect with her [facility] [address] DARLYN

## 2024-05-25 ENCOUNTER — Ambulatory Visit: Admitting: Podiatry

## 2024-06-20 ENCOUNTER — Ambulatory Visit (INDEPENDENT_AMBULATORY_CARE_PROVIDER_SITE_OTHER)

## 2024-06-20 ENCOUNTER — Ambulatory Visit: Admitting: Podiatry

## 2024-06-20 ENCOUNTER — Encounter: Payer: Self-pay | Admitting: Podiatry

## 2024-06-20 VITALS — Ht 59.0 in | Wt 180.6 lb

## 2024-06-20 DIAGNOSIS — M7751 Other enthesopathy of right foot: Secondary | ICD-10-CM | POA: Diagnosis not present

## 2024-06-20 DIAGNOSIS — M19071 Primary osteoarthritis, right ankle and foot: Secondary | ICD-10-CM

## 2024-06-20 MED ORDER — MELOXICAM 15 MG PO TABS: 15.0000 mg | ORAL_TABLET | Freq: Every day | ORAL | 1 refills | Status: AC

## 2024-06-20 MED ORDER — BETAMETHASONE SOD PHOS & ACET 6 (3-3) MG/ML IJ SUSP
3.0000 mg | Freq: Once | INTRAMUSCULAR | Status: AC
  Administered 2024-06-20: 3 mg via INTRA_ARTICULAR

## 2024-06-20 NOTE — Progress Notes (Signed)
   Chief Complaint  Patient presents with   Foot Pain    Pt is here due to right foot pain, she states the pain has been going on for about 6-8 months, getting worst over time, the pain is at the top of the foot and radiates to the second toe, she says it feels like there is a knot on the foot, has had some swelling to the foot as well.    HPI: 57 y.o. female presenting today as a new patient for evaluation of right foot pain ongoing for several months.  Past Medical History:  Diagnosis Date   Allergy    Diabetes mellitus without complication (HCC)    Type II   Hyperlipidemia    Hypertension    ICS (immotile cilia syndrome)    Reactive airway disease     Past Surgical History:  Procedure Laterality Date   ABLATION     APPENDECTOMY     CHOLECYSTECTOMY     DILATION AND CURETTAGE OF UTERUS      Allergies  Allergen Reactions   Elemental Sulfur    Sulfa Antibiotics Cough   Voltaren [Diclofenac Sodium]    Ozempic  (0.25 Or 0.5 Mg-Dose) [Semaglutide (0.25 Or 0.5mg -Dos)]     vomiting     Physical Exam: General: The patient is alert and oriented x3 in no acute distress.  Dermatology: Skin is warm, dry and supple bilateral lower extremities.   Vascular: Palpable pedal pulses bilaterally. Capillary refill within normal limits.  No appreciable edema.  No erythema.  Neurological: Grossly intact via light touch  Musculoskeletal Exam: Arthritic enlargement noted around the midfoot right.  Tenderness with palpation also noted most focused on the second TMT  Radiographic Exam RT foot 06/20/2024:  Normal osseous mineralization.  Degenerative changes noted specifically around the 2nd and 3rd TMT of the right foot  Assessment/Plan of Care: 1.  DJD/arthritis second third TMT right foot  -Patient evaluated.  X-rays reviewed -Injection of 0.5 cc Celestone  Soluspan injected around the 2nd and 3rd TMT right foot -Prescription for meloxicam  15 mg daily -Advised against going barefoot.   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 Thresa EMERSON Sar, DPM    [address] [place], [place] [address]                Office [phone]  Fax [phone]

## 2024-06-28 ENCOUNTER — Inpatient Hospital Stay: Attending: Hematology

## 2024-06-28 DIAGNOSIS — D509 Iron deficiency anemia, unspecified: Secondary | ICD-10-CM | POA: Diagnosis present

## 2024-06-28 DIAGNOSIS — D500 Iron deficiency anemia secondary to blood loss (chronic): Secondary | ICD-10-CM

## 2024-06-28 LAB — CBC WITH DIFFERENTIAL/PLATELET
Abs Immature Granulocytes: 0.02 K/uL (ref 0.00–0.07)
Basophils Absolute: 0.1 K/uL (ref 0.0–0.1)
Basophils Relative: 1 %
Eosinophils Absolute: 0.2 K/uL (ref 0.0–0.5)
Eosinophils Relative: 3 %
HCT: 35.6 % — ABNORMAL LOW (ref 36.0–46.0)
Hemoglobin: 12.4 g/dL (ref 12.0–15.0)
Immature Granulocytes: 0 %
Lymphocytes Relative: 33 %
Lymphs Abs: 2.2 K/uL (ref 0.7–4.0)
MCH: 32 pg (ref 26.0–34.0)
MCHC: 34.8 g/dL (ref 30.0–36.0)
MCV: 91.8 fL (ref 80.0–100.0)
Monocytes Absolute: 0.7 K/uL (ref 0.1–1.0)
Monocytes Relative: 10 %
Neutro Abs: 3.6 K/uL (ref 1.7–7.7)
Neutrophils Relative %: 53 %
Platelets: 236 K/uL (ref 150–400)
RBC: 3.88 MIL/uL (ref 3.87–5.11)
RDW: 12.5 % (ref 11.5–15.5)
WBC: 6.8 K/uL (ref 4.0–10.5)
nRBC: 0 % (ref 0.0–0.2)

## 2024-06-28 LAB — FERRITIN: Ferritin: 28 ng/mL (ref 11–307)

## 2024-06-30 ENCOUNTER — Telehealth: Payer: Self-pay | Admitting: Nurse Practitioner

## 2024-06-30 NOTE — Telephone Encounter (Signed)
 Rescheduled appointment per room/resource. Called and left a VM with appointment details for the patient.

## 2024-07-04 NOTE — Progress Notes (Deleted)
 Patient Care Team: Alphonsa Glendia LABOR, MD as PCP - General (Family Medicine)  Clinic Day:  07/05/2024  Referring physician: Alphonsa Glendia LABOR, MD  I connected with Courtney Hunter on 07/05/24 at 10:00 AM EDT by telephone and verified that I am speaking with the correct person using two identifiers.   I discussed the limitations, risks, security and privacy concerns of performing an evaluation and management service by telemedicine and the availability of in-person appointments. I also discussed with the patient that there may be a patient responsible charge related to this service. The patient expressed understanding and agreed to proceed.   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 She does not need IV iron at this time -switched to pre-natal vitamin for additional 3 months if she can tolerate, as ferritin has remained low/normal.  -Repeat CBC on 02/03/2024 showed continued resolution of IDA with Hgb 12.7 and HCT 36.5. -She does not plan to donate blood again.  -continue to take prenatal vitamin with iron everyday.  -due to persistent fatigue, will repeat CBC and iron studies in 6 weeks and follow up with her over telephone to discuss results.  -03/24/2024.  She is maintaining normal Hgb at 12.1.  HCT slightly low at 34.6.  MCV is normal at 90.3.  Her ferritin is low/normal at 22.  This is improved from check 4 months ago when ferritin was 12. - As she has maintained  resolution of IDA, she can change to as needed visits and follow up with primary care.  -PCP visit in 04/2024 as scheduled.  Will ask that they check CBC, iron study, and thyroid panel during interim visit.    The patient understands the plans discussed today and is in agreement with them.  She knows to contact our office if she develops concerns prior to her next appointment.  I provided *** minutes of face-to-face time during this encounter and > 50% was spent counseling as documented under my assessment and plan.    Powell FORBES Lessen, NP  [facility] - A DEPT OF [facility] [address] [place] [address] Dept: [phone] Dept Fax: [phone]   No orders of the defined types were placed in this encounter.     CHIEF COMPLAINT:  CC: Iron deficiency anemia due to chronic blood loss  Current Treatment:  ***  INTERVAL HISTORY:  Courtney Hunter is here today for repeat clinical assessment.  She had a phone visit with me on 03/31/2024.  She had labs checked on 06/28/2024.  Hgb was normal at 12.4 and HCT was slightly low at 35.6.  Ferritin was normal and slightly lower than last check at 28.  She denies fevers or chills. She denies pain. Her appetite is good. Her weight {Weight change:10426}.  I have reviewed the past medical history, past surgical history, social history and family history with the patient and they are unchanged from previous note.  ALLERGIES:  is  allergic to elemental sulfur, sulfa antibiotics, voltaren [diclofenac sodium], and ozempic  (0.25 or 0.5 mg-dose) [semaglutide (0.25 or 0.5mg -dos)].  MEDICATIONS:  Current Outpatient Medications  Medication Sig Dispense Refill   ADVAIR  HFA 230-21 MCG/ACT inhaler Brand-name only 2 puffs twice daily 1 each 12   albuterol  (VENTOLIN  HFA) 108 (90 Base) MCG/ACT inhaler USE 2 PUFFS INTO LUNGS EVERY 4 HOURS AS NEEDED FOR WHEEZING OR SHORTNESS OF BREATH 18 g 5   ALPRAZolam   (XANAX ) 1 MG tablet TAKE ONE TABLET (1 MG TOTAL) BY MOUTH ATBEDTIME AS NEEDED FOR SLEEP. 30 tablet 5   Cyanocobalamin  (VITAMIN B 12 PO) Take by mouth.     hydrochlorothiazide  (HYDRODIURIL ) 25 MG tablet TAKE 1/2 TO 1 TABLET BY MOUTH EVERY MORNING AS NEEDED FOR EDEMA 90 tablet 1   lisinopril  (ZESTRIL ) 10 MG tablet 1 qam 90 tablet 1   meloxicam  (MOBIC ) 15 MG tablet Take 1 tablet (15 mg total) by mouth daily. 60 tablet 1   metFORMIN  (GLUCOPHAGE ) 500 MG tablet TAKE ONE (1) TABLET BY MOUTH EVERY DAY WITH A MEAL 90 tablet 1   rosuvastatin  (CRESTOR ) 20 MG tablet Take 1 tablet (20 mg total) by mouth daily. 90 tablet 1   sertraline  (ZOLOFT ) 100 MG tablet Take 1.5 tablets (150 mg total) by mouth daily. 135 tablet 1   No current facility-administered medications for this visit.    HISTORY OF PRESENT ILLNESS:   Oncology History   No history exists.      REVIEW OF SYSTEMS:   Constitutional: Denies fevers, chills or abnormal weight loss Eyes: Denies blurriness of vision Ears, nose, mouth, throat, and face: Denies mucositis or sore throat Respiratory: Denies cough, dyspnea or wheezes Cardiovascular: Denies palpitation, chest discomfort or lower extremity swelling Gastrointestinal:  Denies nausea, heartburn or change in bowel habits Skin: Denies abnormal skin rashes Lymphatics: Denies new lymphadenopathy or easy bruising Neurological:Denies numbness, tingling or new weaknesses Behavioral/Psych: Mood is stable, no new changes  All other systems were reviewed with the patient and are negative.   VITALS:  There were no vitals taken for this visit.  Wt Readings from Last 3 Encounters:  06/20/24 180 lb 9.6 oz (81.9 kg)  05/23/24 180 lb 9.6 oz (81.9 kg)  05/03/24 181 lb (82.1 kg)    There is no height or weight on file to calculate BMI.  Performance status (ECOG): {CHL ONC [id number]  PHYSICAL EXAM:   GENERAL:alert, no distress and comfortable SKIN: skin color, texture, turgor are  normal, no rashes or significant lesions EYES: normal, Conjunctiva are pink and non-injected, sclera clear OROPHARYNX:no exudate, no erythema and lips, buccal mucosa, and tongue normal  NECK: supple, thyroid normal size, non-tender, without nodularity LYMPH:  no palpable lymphadenopathy in the cervical, axillary or inguinal LUNGS: clear to auscultation and percussion with normal breathing effort HEART: regular rate & rhythm and no murmurs and no lower extremity edema ABDOMEN:abdomen soft, non-tender and normal bowel sounds Musculoskeletal:no cyanosis of digits and no clubbing  NEURO: alert & oriented x 3 with fluent speech, no focal motor/sensory deficits  LABORATORY DATA:  I have reviewed the data as listed    Component Value Date/Time   NA 136 05/11/2024 0855   K 3.7 05/11/2024 0855   CL 97 05/11/2024 0855   CO2 24 05/11/2024 0855   GLUCOSE 115 (H) 05/11/2024 0855   GLUCOSE 96 07/10/2023 1521   BUN 12 05/11/2024 0855   CREATININE 0.80 05/11/2024 0855   CREATININE 0.89 01/27/2014 0914   CALCIUM  10.1 05/11/2024 0855  PROT 7.1 05/11/2024 0855   ALBUMIN 4.6 05/11/2024 0855   AST 38 05/11/2024 0855   ALT 42 (H) 05/11/2024 0855   ALKPHOS 50 05/11/2024 0855   BILITOT 0.4 05/11/2024 0855   GFRNONAA >60 07/10/2023 1521   GFRAA 76 08/10/2020 0905    No results found for: SPEP, UPEP  Lab Results  Component Value Date   WBC 6.8 06/28/2024   NEUTROABS 3.6 06/28/2024   HGB 12.4 06/28/2024   HCT 35.6 (L) 06/28/2024   MCV 91.8 06/28/2024   PLT 236 06/28/2024      Chemistry      Component Value Date/Time   NA 136 05/11/2024 0855   K 3.7 05/11/2024 0855   CL 97 05/11/2024 0855   CO2 24 05/11/2024 0855   BUN 12 05/11/2024 0855   CREATININE 0.80 05/11/2024 0855   CREATININE 0.89 01/27/2014 0914      Component Value Date/Time   CALCIUM  10.1 05/11/2024 0855   ALKPHOS 50 05/11/2024 0855   AST 38 05/11/2024 0855   ALT 42 (H) 05/11/2024 0855   BILITOT 0.4 05/11/2024 0855        RADIOGRAPHIC STUDIES: I have personally reviewed the radiological images as listed and agreed with the findings in the report. DG Foot Complete Right Result Date: 06/20/2024 Please see detailed radiograph report in office note.

## 2024-07-04 NOTE — Assessment & Plan Note (Signed)
,   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 She does not need IV iron at this time -switched to pre-natal vitamin for additional 3 months if she can tolerate, as ferritin has remained low/normal.  -Repeat CBC on 02/03/2024 showed continued resolution of IDA with Hgb 12.7 and HCT 36.5. -She does not plan to donate blood again.  -continue to take prenatal vitamin with iron everyday.  -due to persistent fatigue, will repeat CBC and iron studies in 6 weeks and follow up with her over telephone to discuss results.  -03/24/2024.  She is maintaining normal Hgb at 12.1.  HCT slightly low at 34.6.  MCV is normal at 90.3.  Her ferritin is low/normal at 22.  This is improved from check 4 months ago when ferritin was 12. - As she has maintained resolution of IDA, she can change to as needed visits and follow up with primary care.  -PCP visit in 04/2024 as scheduled.  Will ask that they check CBC, iron study, and thyroid panel during interim visit.

## 2024-07-05 ENCOUNTER — Telehealth: Payer: Self-pay | Admitting: Nurse Practitioner

## 2024-07-05 ENCOUNTER — Inpatient Hospital Stay: Admitting: Nurse Practitioner

## 2024-07-05 DIAGNOSIS — D500 Iron deficiency anemia secondary to blood loss (chronic): Secondary | ICD-10-CM

## 2024-07-05 NOTE — Telephone Encounter (Signed)
 Two attempts have been made to contact the patient for our phone visit which was scheduled for 10 am. Both phone calls were forwarded to voicemail. I left 2 messages with my personal extension and the main number for medical oncology. I will make a third attempt to contact her.  -Powell Lessen, NP

## 2024-07-07 ENCOUNTER — Telehealth: Payer: Self-pay | Admitting: Nurse Practitioner

## 2024-07-07 NOTE — Telephone Encounter (Signed)
 I contacted [place] to reschedule her telephone appointment. She was not available to schedule for the day and time that works best for her.

## 2024-07-07 NOTE — Telephone Encounter (Signed)
 Pt called in to re-schedule her telephone appointment that was rescheduled from 8am to 10am.

## 2024-07-12 ENCOUNTER — Encounter: Payer: Self-pay | Admitting: Family Medicine

## 2024-07-25 ENCOUNTER — Ambulatory Visit: Payer: Self-pay

## 2024-07-26 ENCOUNTER — Encounter: Payer: Self-pay | Admitting: Family Medicine

## 2024-08-01 ENCOUNTER — Encounter: Payer: Self-pay | Admitting: Podiatry

## 2024-08-01 ENCOUNTER — Ambulatory Visit: Admitting: Podiatry

## 2024-08-01 VITALS — Ht 59.0 in | Wt 180.6 lb

## 2024-08-01 DIAGNOSIS — M19071 Primary osteoarthritis, right ankle and foot: Secondary | ICD-10-CM

## 2024-08-01 NOTE — Progress Notes (Signed)
   Chief Complaint  Patient presents with   Foot Pain    Pt is here to f/u on right foot due to pain, she states that the pain is better then before.    HPI: 58 y.o. female presenting today for follow-up evaluation of right foot arthritic pain.  Overall the patient states that she is significantly better.  She says that the meloxicam  even helps her spinal stenosis.  Past Medical History:  Diagnosis Date   Allergy    Diabetes mellitus without complication (HCC)    Type II   Hyperlipidemia    Hypertension    ICS (immotile cilia syndrome)    Reactive airway disease     Past Surgical History:  Procedure Laterality Date   ABLATION     APPENDECTOMY     CHOLECYSTECTOMY     DILATION AND CURETTAGE OF UTERUS      Allergies  Allergen Reactions   Elemental Sulfur    Sulfa Antibiotics Cough   Voltaren [Diclofenac Sodium]    Ozempic  (0.25 Or 0.5 Mg-Dose) [Semaglutide (0.25 Or 0.5mg -Dos)]     vomiting     Physical Exam: General: The patient is alert and oriented x3 in no acute distress.  Dermatology: Skin is warm, dry and supple bilateral lower extremities.   Vascular: Palpable pedal pulses bilaterally. Capillary refill within normal limits.  No appreciable edema.  No erythema.  Neurological: Grossly intact via light touch  Musculoskeletal Exam: Arthritic enlargement noted around the midfoot right.  There continues to be some tenderness with palpation also noted most focused on the second TMT  Radiographic Exam RT foot 06/20/2024:  Normal osseous mineralization.  Degenerative changes noted specifically around the 2nd and 3rd TMT of the right foot  Assessment/Plan of Care: 1.  DJD/arthritis second third TMT right foot 2.  History of spinal stenosis right lower extremity  -Patient evaluated.   -Overall the patient is doing significantly better.  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 Thresa EMERSON Sar, DPM    [address] [place], [place] [address]                Office [phone]  Fax [phone]

## 2024-09-05 NOTE — Progress Notes (Signed)
 Patient canceled appointment prior to being contacted by provider.  -Powell Lessen, NP

## 2024-10-21 ENCOUNTER — Other Ambulatory Visit: Payer: Self-pay | Admitting: Nurse Practitioner

## 2024-11-23 ENCOUNTER — Ambulatory Visit: Admitting: Family Medicine
